# Patient Record
Sex: Male | Born: 1935 | Race: White | Hispanic: No | Marital: Married | Smoking: Never smoker
Health system: Southern US, Community
[De-identification: ages and names within clinical notes are randomized; demographics above are authoritative.]

## PROBLEM LIST (undated history)

## (undated) DIAGNOSIS — N289 Disorder of kidney and ureter, unspecified: Secondary | ICD-10-CM

## (undated) DIAGNOSIS — E785 Hyperlipidemia, unspecified: Secondary | ICD-10-CM

## (undated) DIAGNOSIS — K5790 Diverticulosis of intestine, part unspecified, without perforation or abscess without bleeding: Secondary | ICD-10-CM

## (undated) DIAGNOSIS — L570 Actinic keratosis: Secondary | ICD-10-CM

## (undated) DIAGNOSIS — I739 Peripheral vascular disease, unspecified: Secondary | ICD-10-CM

## (undated) DIAGNOSIS — Z87438 Personal history of other diseases of male genital organs: Secondary | ICD-10-CM

## (undated) DIAGNOSIS — I10 Essential (primary) hypertension: Secondary | ICD-10-CM

## (undated) DIAGNOSIS — L719 Rosacea, unspecified: Secondary | ICD-10-CM

## (undated) HISTORY — DX: Personal history of other diseases of male genital organs: Z87.438

## (undated) HISTORY — DX: Hyperlipidemia, unspecified: E78.5

## (undated) HISTORY — DX: Essential (primary) hypertension: I10

## (undated) HISTORY — DX: Diverticulosis of intestine, part unspecified, without perforation or abscess without bleeding: K57.90

## (undated) HISTORY — DX: Actinic keratosis: L57.0

## (undated) HISTORY — DX: Peripheral vascular disease, unspecified: I73.9

## (undated) HISTORY — DX: Disorder of kidney and ureter, unspecified: N28.9

## (undated) HISTORY — DX: Rosacea, unspecified: L71.9

---

## 1996-07-20 HISTORY — PX: HIP ARTHROSCOPY: SUR88

## 1998-01-03 ENCOUNTER — Encounter: Admission: RE | Admit: 1998-01-03 | Discharge: 1998-04-03 | Payer: Self-pay | Admitting: Anesthesiology

## 1998-07-20 HISTORY — PX: OTHER SURGICAL HISTORY: SHX169

## 1998-12-24 ENCOUNTER — Encounter: Payer: Self-pay | Admitting: Specialist

## 1998-12-24 ENCOUNTER — Ambulatory Visit (HOSPITAL_COMMUNITY): Admission: RE | Admit: 1998-12-24 | Discharge: 1998-12-24 | Payer: Self-pay | Admitting: Specialist

## 1999-01-27 ENCOUNTER — Encounter: Payer: Self-pay | Admitting: Specialist

## 1999-02-04 ENCOUNTER — Inpatient Hospital Stay (HOSPITAL_COMMUNITY): Admission: RE | Admit: 1999-02-04 | Discharge: 1999-02-08 | Payer: Self-pay | Admitting: Specialist

## 1999-02-04 ENCOUNTER — Encounter: Payer: Self-pay | Admitting: Specialist

## 2000-07-10 ENCOUNTER — Emergency Department (HOSPITAL_COMMUNITY): Admission: EM | Admit: 2000-07-10 | Discharge: 2000-07-10 | Payer: Self-pay | Admitting: Emergency Medicine

## 2000-07-10 ENCOUNTER — Encounter: Payer: Self-pay | Admitting: Emergency Medicine

## 2003-07-03 ENCOUNTER — Ambulatory Visit (HOSPITAL_COMMUNITY): Admission: RE | Admit: 2003-07-03 | Discharge: 2003-07-03 | Payer: Self-pay | Admitting: Gastroenterology

## 2003-07-03 ENCOUNTER — Encounter (INDEPENDENT_AMBULATORY_CARE_PROVIDER_SITE_OTHER): Payer: Self-pay | Admitting: *Deleted

## 2004-06-17 ENCOUNTER — Encounter: Admission: RE | Admit: 2004-06-17 | Discharge: 2004-06-17 | Payer: Self-pay | Admitting: Specialist

## 2004-06-30 ENCOUNTER — Encounter: Admission: RE | Admit: 2004-06-30 | Discharge: 2004-06-30 | Payer: Self-pay | Admitting: Specialist

## 2004-07-15 ENCOUNTER — Encounter: Admission: RE | Admit: 2004-07-15 | Discharge: 2004-07-15 | Payer: Self-pay | Admitting: Specialist

## 2004-07-28 ENCOUNTER — Encounter: Admission: RE | Admit: 2004-07-28 | Discharge: 2004-07-28 | Payer: Self-pay | Admitting: Specialist

## 2005-08-26 ENCOUNTER — Encounter: Admission: RE | Admit: 2005-08-26 | Discharge: 2005-08-26 | Payer: Self-pay | Admitting: Specialist

## 2005-11-20 ENCOUNTER — Encounter: Admission: RE | Admit: 2005-11-20 | Discharge: 2005-11-20 | Payer: Self-pay | Admitting: Orthopaedic Surgery

## 2005-12-04 ENCOUNTER — Encounter: Admission: RE | Admit: 2005-12-04 | Discharge: 2005-12-04 | Payer: Self-pay | Admitting: Orthopaedic Surgery

## 2005-12-21 ENCOUNTER — Ambulatory Visit: Payer: Self-pay | Admitting: Family Medicine

## 2005-12-21 ENCOUNTER — Ambulatory Visit: Payer: Self-pay | Admitting: Cardiology

## 2005-12-22 ENCOUNTER — Ambulatory Visit: Payer: Self-pay | Admitting: Family Medicine

## 2006-01-08 ENCOUNTER — Encounter: Admission: RE | Admit: 2006-01-08 | Discharge: 2006-01-08 | Payer: Self-pay | Admitting: Orthopaedic Surgery

## 2006-02-08 ENCOUNTER — Encounter: Admission: RE | Admit: 2006-02-08 | Discharge: 2006-02-08 | Payer: Self-pay | Admitting: Nephrology

## 2006-03-15 ENCOUNTER — Encounter: Admission: RE | Admit: 2006-03-15 | Discharge: 2006-03-15 | Payer: Self-pay | Admitting: Nephrology

## 2006-05-05 ENCOUNTER — Ambulatory Visit: Payer: Self-pay | Admitting: Family Medicine

## 2006-06-29 ENCOUNTER — Encounter: Admission: RE | Admit: 2006-06-29 | Discharge: 2006-06-29 | Payer: Self-pay | Admitting: Urology

## 2006-07-06 ENCOUNTER — Ambulatory Visit: Payer: Self-pay | Admitting: Family Medicine

## 2006-09-20 ENCOUNTER — Ambulatory Visit: Payer: Self-pay | Admitting: Family Medicine

## 2006-10-22 ENCOUNTER — Encounter
Admission: RE | Admit: 2006-10-22 | Discharge: 2006-10-22 | Payer: Self-pay | Admitting: Physical Medicine and Rehabilitation

## 2007-01-04 ENCOUNTER — Ambulatory Visit: Payer: Self-pay | Admitting: Family Medicine

## 2007-01-28 ENCOUNTER — Encounter: Admission: RE | Admit: 2007-01-28 | Discharge: 2007-01-28 | Payer: Self-pay | Admitting: Nephrology

## 2007-02-17 ENCOUNTER — Emergency Department (HOSPITAL_COMMUNITY): Admission: EM | Admit: 2007-02-17 | Discharge: 2007-02-17 | Payer: Self-pay | Admitting: Emergency Medicine

## 2007-04-25 ENCOUNTER — Ambulatory Visit: Payer: Self-pay | Admitting: Family Medicine

## 2007-06-27 ENCOUNTER — Ambulatory Visit: Payer: Self-pay | Admitting: Family Medicine

## 2007-07-21 HISTORY — PX: COLONOSCOPY: SHX174

## 2007-09-13 ENCOUNTER — Ambulatory Visit: Payer: Self-pay | Admitting: Family Medicine

## 2007-10-10 HISTORY — PX: DOPPLER ECHOCARDIOGRAPHY: SHX263

## 2007-10-10 HISTORY — PX: NM MYOCAR PERF WALL MOTION: HXRAD629

## 2007-12-15 IMAGING — CT CT L SPINE W/ CM
3 of 8 series · 13 of 33 positions shown, 16 images · IV contrast (omnipaque)
Comparison: none

CLINICAL DATA: Back pain with bilateral hip and leg pain worse on the right than the left.   The pain is particularly severe in the right thigh.
 LUMBAR MYELOGRAM:
TECHNIQUE: A lumbar puncture was performed from a right sided approach at the L1-2 interlaminar space using a 22 gauge spinal needle.  15 cc of Omnipaque 180 were instilled.  
 Comparison is made to the CT of 06/17/04 which showed fusion at the L5-S1 level with anterolisthesis of L4 on L5.
TECHNIQUE: Multidetector CT imaging of the lumbar spine was performed after intrathecal injection of contrast.  Multiplanar CT image reconstructions were also generated.

[Series 4: recon 3: l-spine helical · axial · 0.27mm/px · z∈[-81,+75]mm · 5 of 167 slices shown, 7 images]
[im 21/167  soft-tissue]
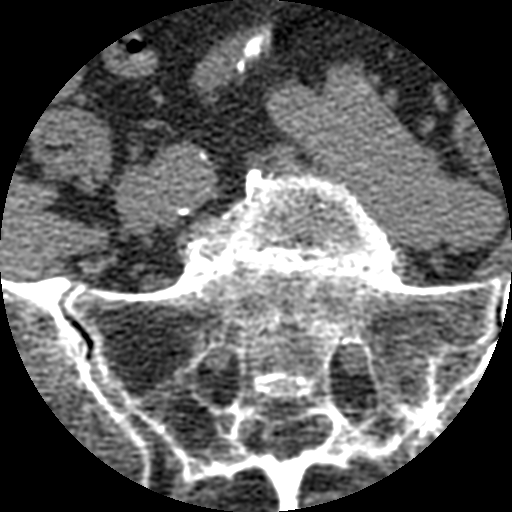
[im 21/167  bone]
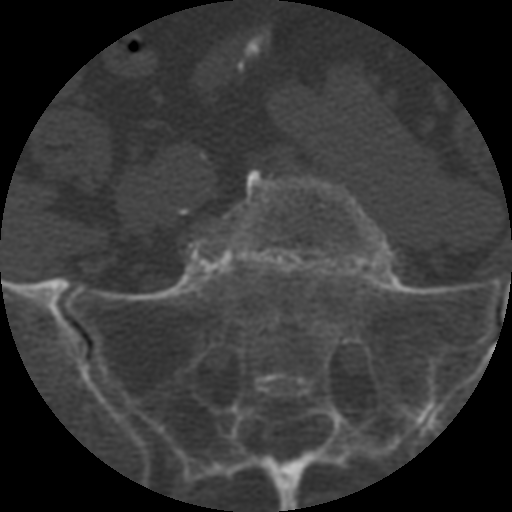
[im 63/167  bone]
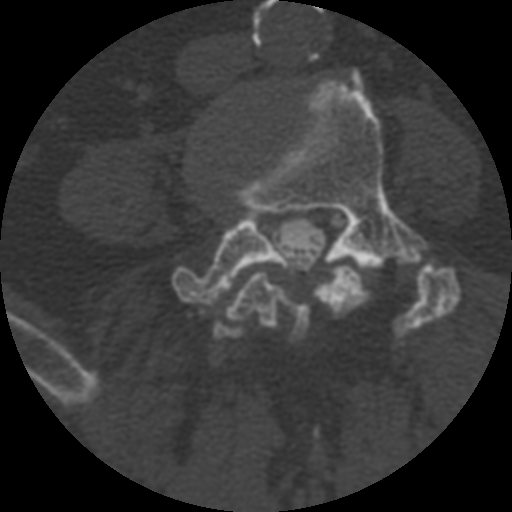
[im 84/167  bone]
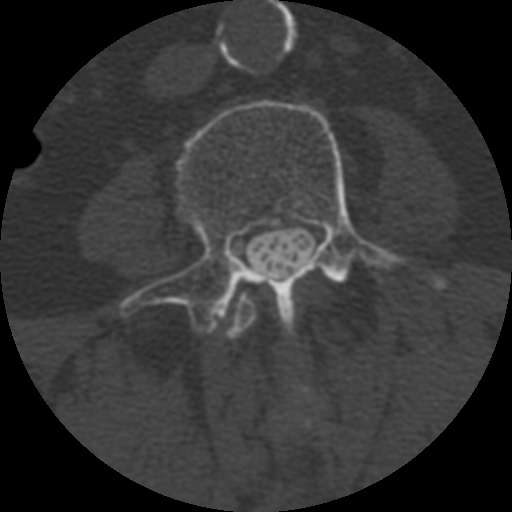
[im 104/167  bone]
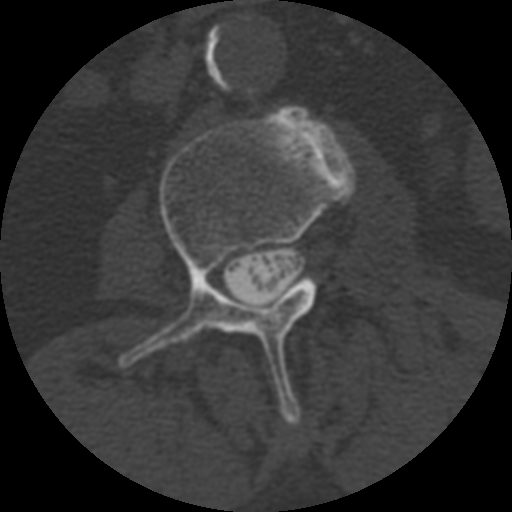
[im 146/167  soft-tissue]
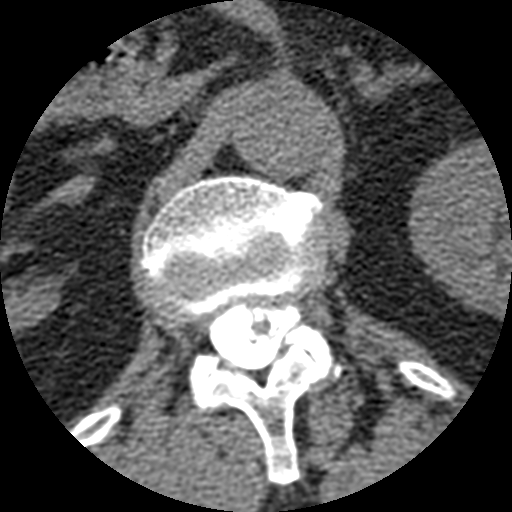
[im 146/167  bone]
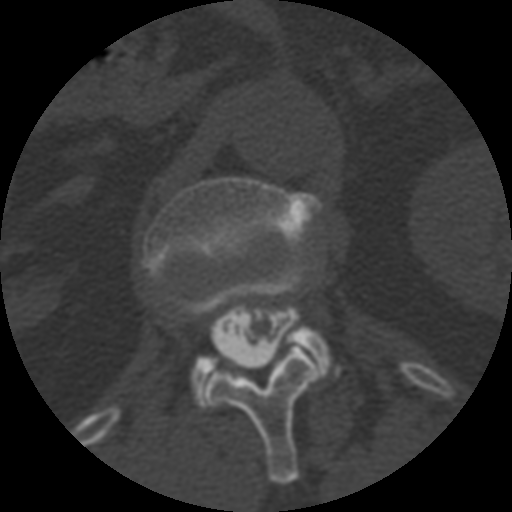

[Series 400: reformatted · sagittal · 0.42mm/px · 3 of 48 slices shown (1 of 2)]
[im 10/48  bone]
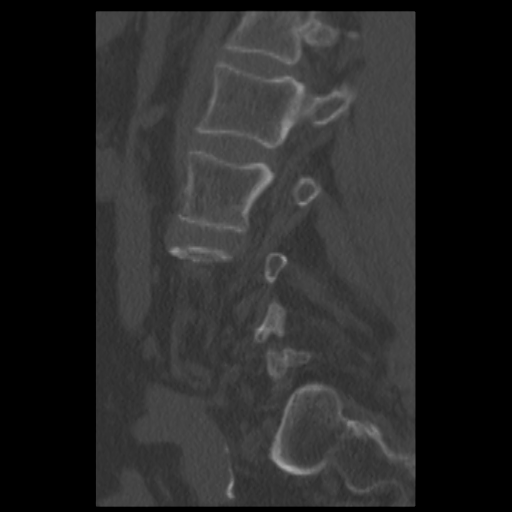
[im 19/48  bone]
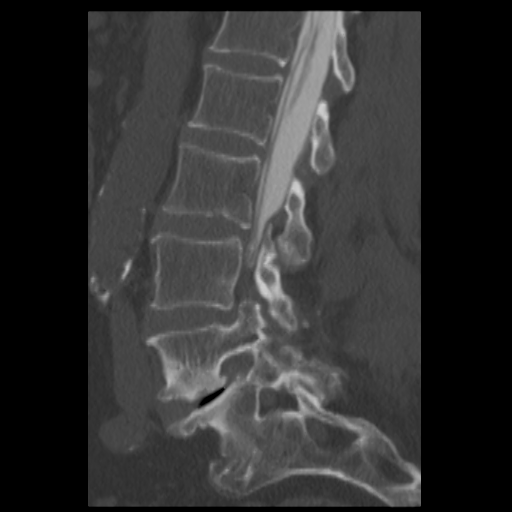
[im 29/48  bone]
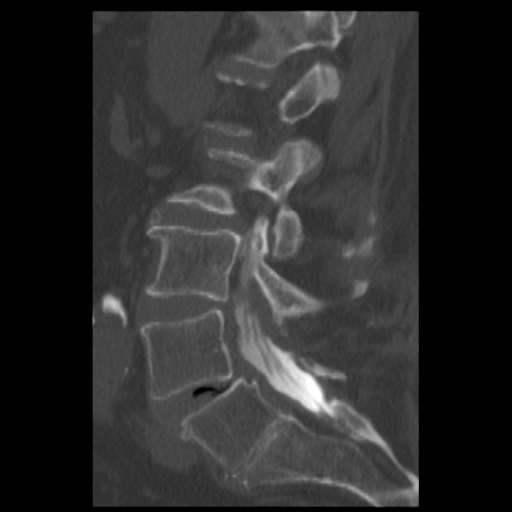

[Series 401: reformatted · coronal · 0.42mm/px · 5 of 40 slices shown, 6 images (2 of 2)]
[im 14/40  bone]
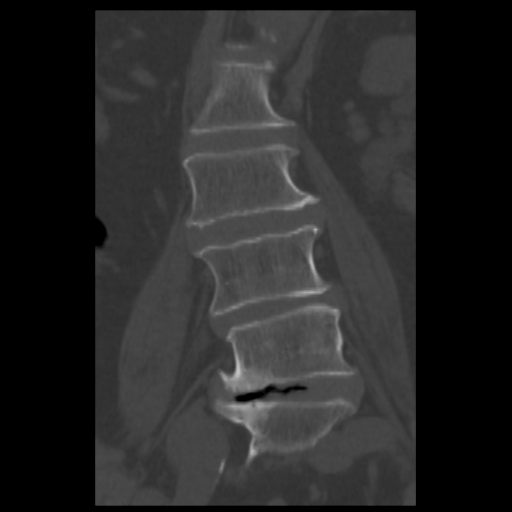
[im 17/40  bone]
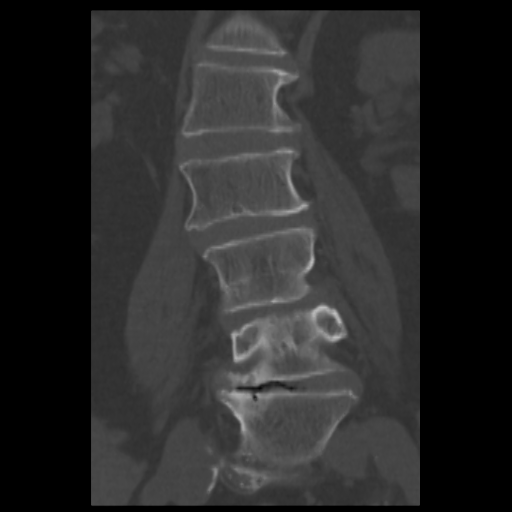
[im 20/40  soft-tissue]
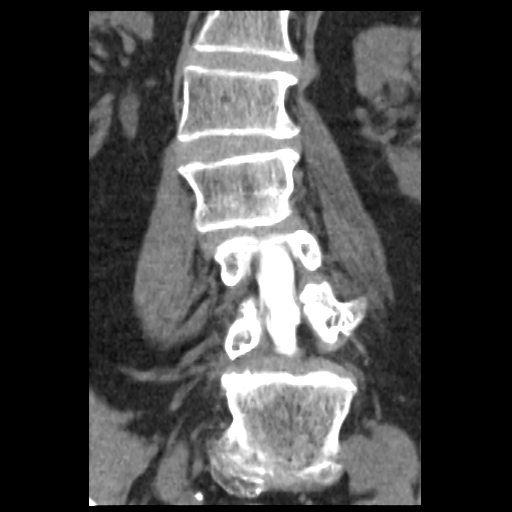
[im 20/40  bone]
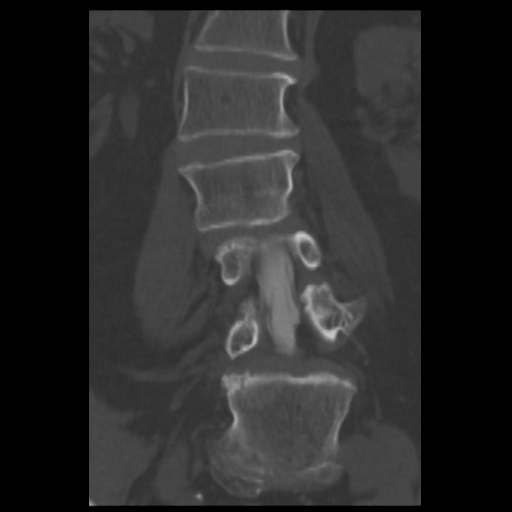
[im 23/40  bone]
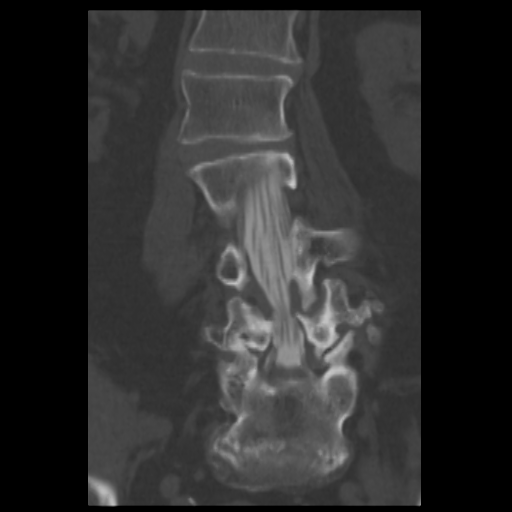
[im 27/40  bone]
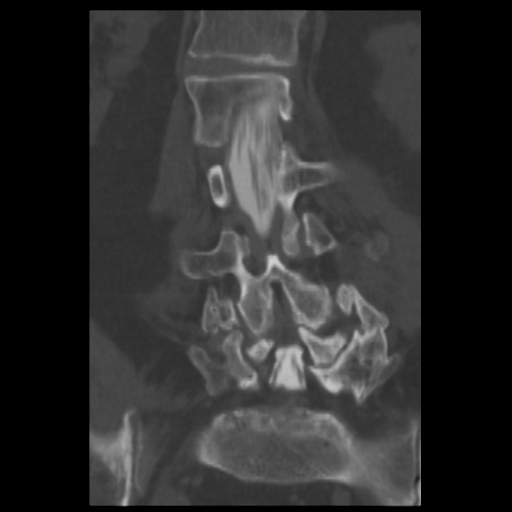

[13 of 33 positions shown; findings below may reference images not displayed]

FINDINGS: Standing lateral flexion and extension views and right to left bending views were performed.  There is scoliosis to the spine convex to the right with the apex at L1.  In the neutral position, there is 9 mm of anterolisthesis of L4 on L5.  With flexion, this may increase a mm.  With extension, I do think it changes.  
 There is a small anterior extradural defect at T12-L1 without evidence of neural compression.  No anterior extradural defect at L1-2 or L2-3. 
 At L3-4, there is small anterior extradural defect.  There is narrowing of the lateral recesses bilaterally.  With standing, this appears slightly exaggerated, particularly in the right lateral recess. 
 At L4-5, the central canal is well-decompressed, but there is narrowing of the lateral recesses, more on the right than on the left.  This does not change grossly with standing.  
 At L5-S1, no stenosis or neural compression is seen.
IMPRESSION: 1.  Small anterior extradural defect at T12-L1 without neural compression.
 2.  Degenerative disk disease at L3-4 with narrowing of the lateral recesses.  This appears slightly more pronounced on the standing films, more on the right than the left. 
 3.  Anterolisthesis at L4-5 of 9 mm.   This may increase a mm with flexion.  The patient has been decompressed posteriorly.  There is narrowing of the lateral recesses of a mild to moderate degree. 
 4.    Prior fusion at L5-S1 with apparently sufficient patency of the canal without evidence of S1 nerve root compression.  See results of CT scan below.
 POST MYELOGRAM CT SCAN OF THE LUMBAR SPINE:
FINDINGS: T12-L1:  There is a shallow broad-based disk herniation that indents the thecal sac minimally, but does not appear to result in neural compression.
 L1-2:  Minimal disk bulge.  No stenosis.
 L2-3:  Mild disk bulge.  Mild facet and ligamentous hypertrophy.  Mild narrowing of the lateral recesses without neural compression.  The foramina are widely patent. 
 L3-4:    The disk bulges mildly in a circumferential fashion. The facets and ligaments are hypertrophic.  There is mild narrowing of the lateral recesses.  Either L4 nerve root could be affected.  There is mild narrowing of the neural foramina without distinct compression of the exiting L3 nerve roots. 
 L4-5:  The patient has had previous posterior decompression.  There is anterolisthesis of L4 on L5 of 6 to 7 mm.  The disk is degenerated with vacuum phenomenon. There is circumferential protrusion of disk material eccentrically more pronounced towards the right.  There is foraminal extension on the right that seems likely to compress the right L4 nerve root.  The left L4 nerve root appears to exit without compression.  There is marked facet arthropathy bilaterally.  
 L5-S1:  There is fusion at this level.  There is not any significant disk material.  The canal and foramina appear widely patent.
IMPRESSION: 1.  At L4-5, the patient has had previous posterior decompression with sufficient decompression of the central canal.  However, there is 7 mm of anterolisthesis and there is a broad-based disk herniation eccentrically more prominent towards the right with foraminal extension on the right that seems certain to compress the right L4 nerve root.  In addition, the lateral recesses are narrowed and it is conceivable that either L5 nerve root could be irritated though they do not appear to be distinctly compressed.  Pronounced facet arthropathy bilaterally.
 2.  At L3-4, disk degeneration with shallow circumferential protrusion.   Facet and ligamentous hypertrophy.  Mild narrowing of the lateral recesses and neural foramina without distinct compression.  Note that this condition may be worsened with standing as was demonstrated at myelography.

## 2007-12-15 IMAGING — RF DG MYELOGRAM LUMBAR
12 of 15 series · 12 of 15 positions shown · IV contrast (omnipaque)
Comparison: none

CLINICAL DATA: Back pain with bilateral hip and leg pain worse on the right than the left.   The pain is particularly severe in the right thigh.
 LUMBAR MYELOGRAM:
TECHNIQUE: A lumbar puncture was performed from a right sided approach at the L1-2 interlaminar space using a 22 gauge spinal needle.  15 cc of Omnipaque 180 were instilled.  
 Comparison is made to the CT of 06/17/04 which showed fusion at the L5-S1 level with anterolisthesis of L4 on L5.
TECHNIQUE: Multidetector CT imaging of the lumbar spine was performed after intrathecal injection of contrast.  Multiplanar CT image reconstructions were also generated.

[Series 2: (hospital) · 1 of 1 slices shown]
[im 1/1]
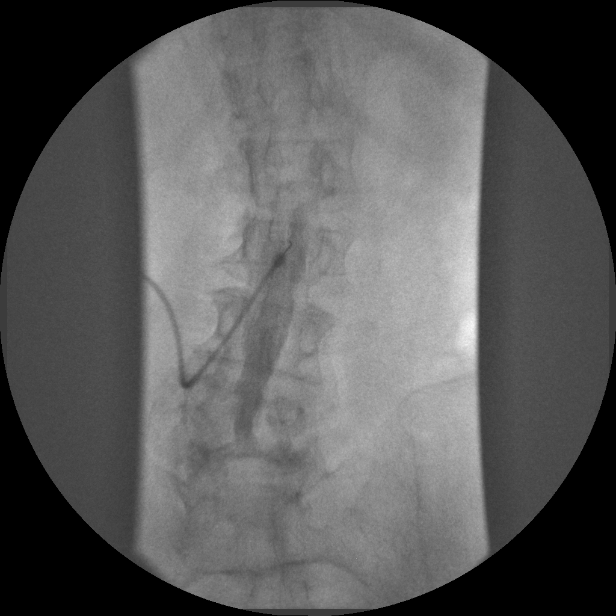

[Series 3: myelogram  white · 1 of 1 slices shown (1 of 11)]
[im 1/1]
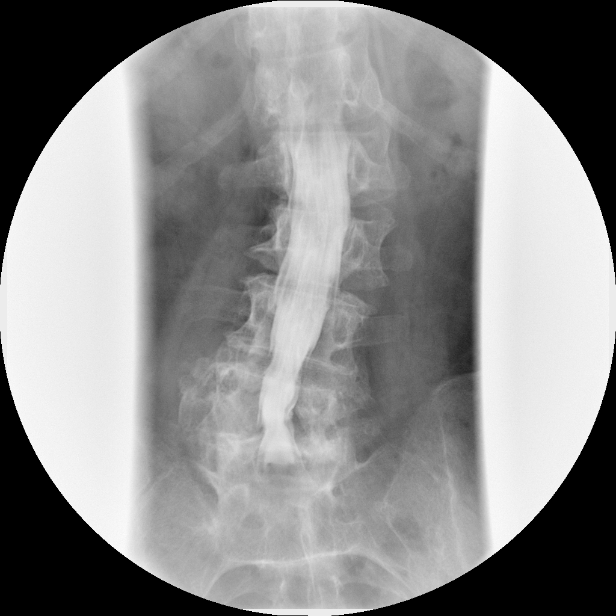

[Series 5: myelogram  white · 1 of 1 slices shown (2 of 11)]
[im 1/1]
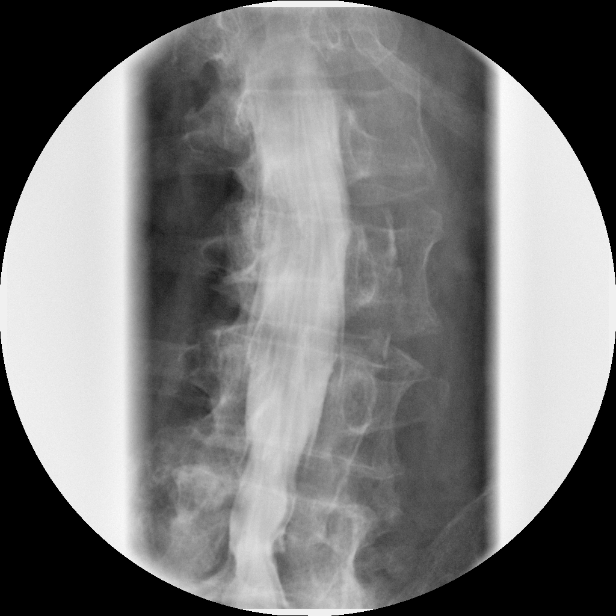

[Series 6: myelogram  white · 1 of 1 slices shown (3 of 11)]
[im 1/1]
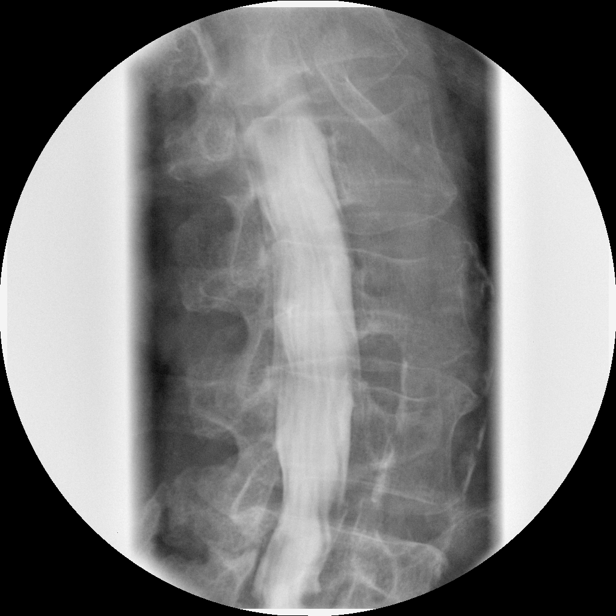

[Series 7: myelogram  white · 1 of 1 slices shown (4 of 11)]
[im 1/1]
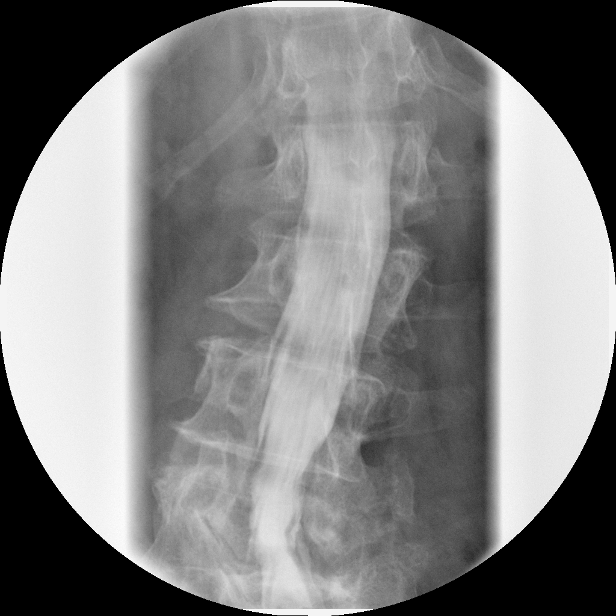

[Series 8: myelogram  white · 1 of 1 slices shown (5 of 11)]
[im 1/1]
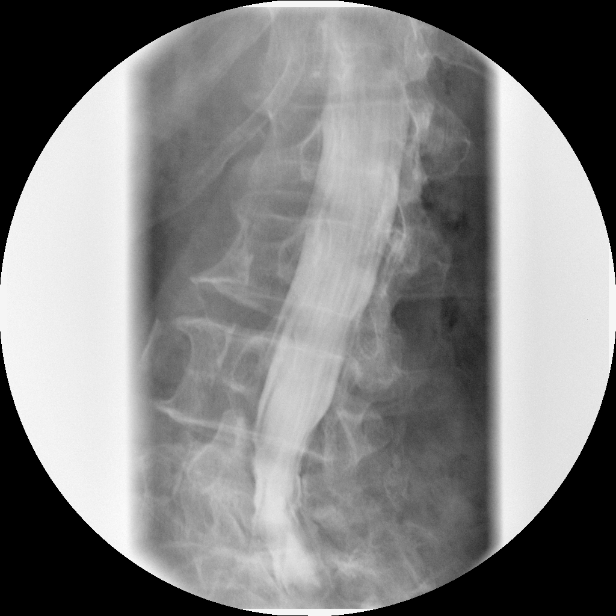

[Series 10: myelogram  white · 1 of 1 slices shown (6 of 11)]
[im 1/1]
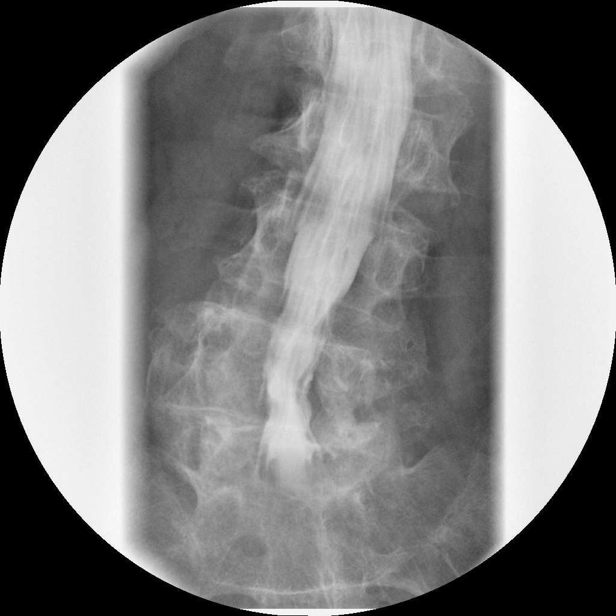

[Series 11: myelogram  white · 1 of 1 slices shown (7 of 11)]
[im 1/1]
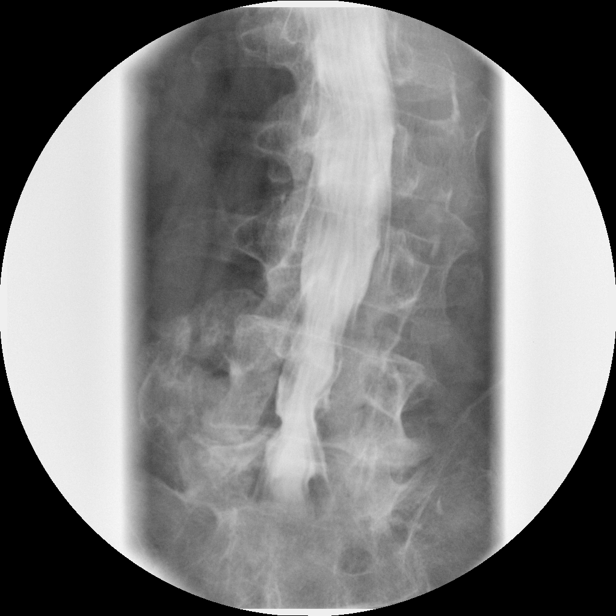

[Series 12: myelogram  white · 1 of 1 slices shown (8 of 11)]
[im 1/1]
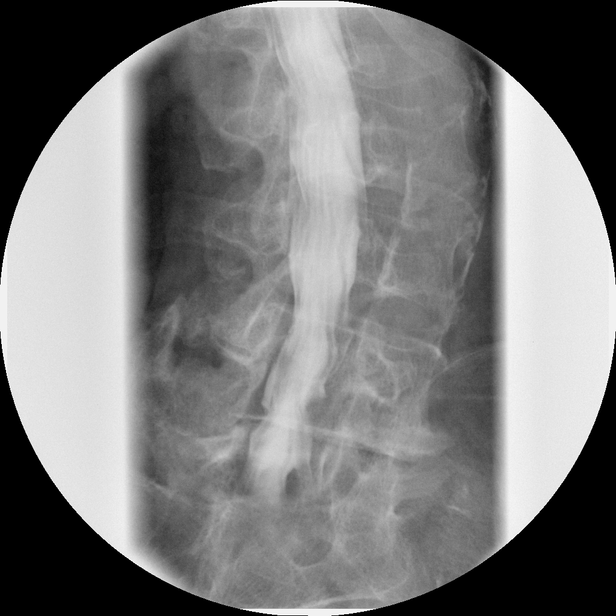

[Series 13: myelogram  white · 1 of 1 slices shown (9 of 11)]
[im 1/1]
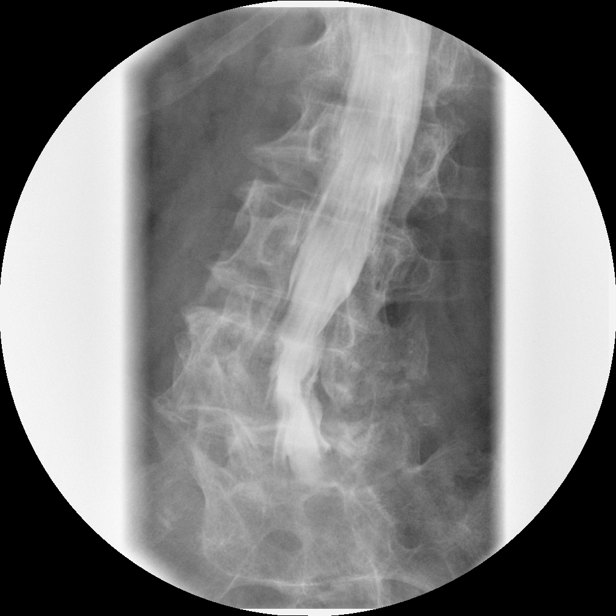

[Series 15: myelogram  white · 1 of 1 slices shown (10 of 11)]
[im 1/1]
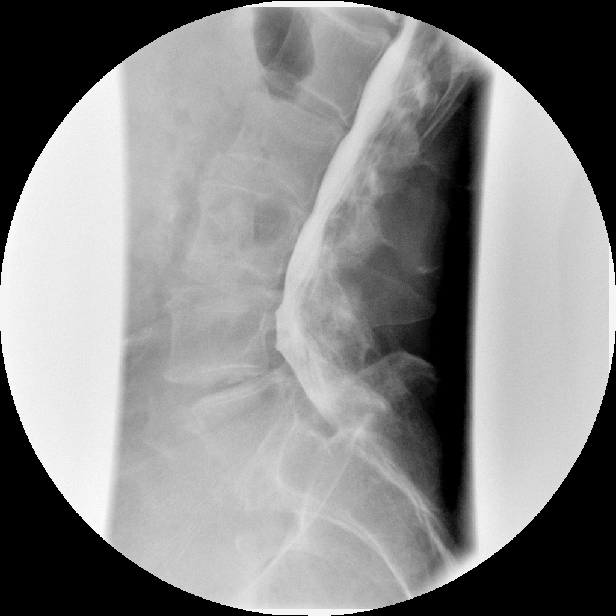

[Series 16: myelogram  white · 1 of 1 slices shown (11 of 11)]
[im 1/1]
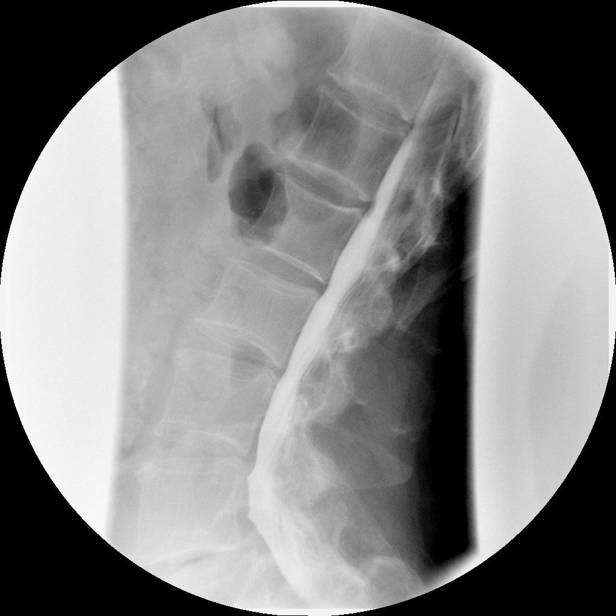

[12 of 15 positions shown; findings below may reference images not displayed]

FINDINGS: Standing lateral flexion and extension views and right to left bending views were performed.  There is scoliosis to the spine convex to the right with the apex at L1.  In the neutral position, there is 9 mm of anterolisthesis of L4 on L5.  With flexion, this may increase a mm.  With extension, I do think it changes.  
 There is a small anterior extradural defect at T12-L1 without evidence of neural compression.  No anterior extradural defect at L1-2 or L2-3. 
 At L3-4, there is small anterior extradural defect.  There is narrowing of the lateral recesses bilaterally.  With standing, this appears slightly exaggerated, particularly in the right lateral recess. 
 At L4-5, the central canal is well-decompressed, but there is narrowing of the lateral recesses, more on the right than on the left.  This does not change grossly with standing.  
 At L5-S1, no stenosis or neural compression is seen.
IMPRESSION: 1.  Small anterior extradural defect at T12-L1 without neural compression.
 2.  Degenerative disk disease at L3-4 with narrowing of the lateral recesses.  This appears slightly more pronounced on the standing films, more on the right than the left. 
 3.  Anterolisthesis at L4-5 of 9 mm.   This may increase a mm with flexion.  The patient has been decompressed posteriorly.  There is narrowing of the lateral recesses of a mild to moderate degree. 
 4.    Prior fusion at L5-S1 with apparently sufficient patency of the canal without evidence of S1 nerve root compression.  See results of CT scan below.
 POST MYELOGRAM CT SCAN OF THE LUMBAR SPINE:
FINDINGS: T12-L1:  There is a shallow broad-based disk herniation that indents the thecal sac minimally, but does not appear to result in neural compression.
 L1-2:  Minimal disk bulge.  No stenosis.
 L2-3:  Mild disk bulge.  Mild facet and ligamentous hypertrophy.  Mild narrowing of the lateral recesses without neural compression.  The foramina are widely patent. 
 L3-4:    The disk bulges mildly in a circumferential fashion. The facets and ligaments are hypertrophic.  There is mild narrowing of the lateral recesses.  Either L4 nerve root could be affected.  There is mild narrowing of the neural foramina without distinct compression of the exiting L3 nerve roots. 
 L4-5:  The patient has had previous posterior decompression.  There is anterolisthesis of L4 on L5 of 6 to 7 mm.  The disk is degenerated with vacuum phenomenon. There is circumferential protrusion of disk material eccentrically more pronounced towards the right.  There is foraminal extension on the right that seems likely to compress the right L4 nerve root.  The left L4 nerve root appears to exit without compression.  There is marked facet arthropathy bilaterally.  
 L5-S1:  There is fusion at this level.  There is not any significant disk material.  The canal and foramina appear widely patent.
IMPRESSION: 1.  At L4-5, the patient has had previous posterior decompression with sufficient decompression of the central canal.  However, there is 7 mm of anterolisthesis and there is a broad-based disk herniation eccentrically more prominent towards the right with foraminal extension on the right that seems certain to compress the right L4 nerve root.  In addition, the lateral recesses are narrowed and it is conceivable that either L5 nerve root could be irritated though they do not appear to be distinctly compressed.  Pronounced facet arthropathy bilaterally.
 2.  At L3-4, disk degeneration with shallow circumferential protrusion.   Facet and ligamentous hypertrophy.  Mild narrowing of the lateral recesses and neural foramina without distinct compression.  Note that this condition may be worsened with standing as was demonstrated at myelography.

## 2007-12-29 ENCOUNTER — Ambulatory Visit: Payer: Self-pay | Admitting: Family Medicine

## 2008-01-30 ENCOUNTER — Ambulatory Visit: Payer: Self-pay | Admitting: Family Medicine

## 2008-04-04 ENCOUNTER — Ambulatory Visit: Payer: Self-pay | Admitting: Family Medicine

## 2008-08-06 ENCOUNTER — Ambulatory Visit: Payer: Self-pay | Admitting: Family Medicine

## 2008-09-21 ENCOUNTER — Ambulatory Visit: Payer: Self-pay | Admitting: Family Medicine

## 2008-09-24 ENCOUNTER — Ambulatory Visit: Payer: Self-pay | Admitting: Family Medicine

## 2008-12-11 ENCOUNTER — Ambulatory Visit: Payer: Self-pay | Admitting: Family Medicine

## 2009-04-15 ENCOUNTER — Ambulatory Visit: Payer: Self-pay | Admitting: Family Medicine

## 2009-05-13 ENCOUNTER — Ambulatory Visit: Payer: Self-pay | Admitting: Family Medicine

## 2009-07-15 ENCOUNTER — Ambulatory Visit: Payer: Self-pay | Admitting: Family Medicine

## 2009-08-14 ENCOUNTER — Ambulatory Visit: Payer: Self-pay | Admitting: Family Medicine

## 2009-10-03 ENCOUNTER — Ambulatory Visit: Payer: Self-pay | Admitting: Family Medicine

## 2009-10-14 ENCOUNTER — Ambulatory Visit: Payer: Self-pay | Admitting: Family Medicine

## 2009-10-31 ENCOUNTER — Ambulatory Visit: Payer: Self-pay | Admitting: Family Medicine

## 2010-02-10 ENCOUNTER — Ambulatory Visit: Payer: Self-pay | Admitting: Family Medicine

## 2010-02-10 ENCOUNTER — Encounter: Admission: RE | Admit: 2010-02-10 | Discharge: 2010-02-10 | Payer: Self-pay | Admitting: Family Medicine

## 2010-04-24 ENCOUNTER — Ambulatory Visit: Payer: Self-pay | Admitting: Family Medicine

## 2010-06-18 ENCOUNTER — Ambulatory Visit: Payer: Self-pay | Admitting: Family Medicine

## 2010-07-15 ENCOUNTER — Ambulatory Visit
Admission: RE | Admit: 2010-07-15 | Discharge: 2010-07-15 | Payer: Self-pay | Source: Home / Self Care | Attending: Family Medicine | Admitting: Family Medicine

## 2010-08-06 ENCOUNTER — Ambulatory Visit
Admission: RE | Admit: 2010-08-06 | Discharge: 2010-08-06 | Payer: Self-pay | Source: Home / Self Care | Attending: Family Medicine | Admitting: Family Medicine

## 2010-08-10 ENCOUNTER — Encounter: Payer: Self-pay | Admitting: Orthopedic Surgery

## 2010-08-10 ENCOUNTER — Encounter: Payer: Self-pay | Admitting: Nephrology

## 2010-11-17 ENCOUNTER — Ambulatory Visit (INDEPENDENT_AMBULATORY_CARE_PROVIDER_SITE_OTHER): Payer: Medicare Other | Admitting: Family Medicine

## 2010-11-17 DIAGNOSIS — R3915 Urgency of urination: Secondary | ICD-10-CM

## 2010-11-17 DIAGNOSIS — M76899 Other specified enthesopathies of unspecified lower limb, excluding foot: Secondary | ICD-10-CM

## 2010-11-17 DIAGNOSIS — R32 Unspecified urinary incontinence: Secondary | ICD-10-CM

## 2010-11-22 ENCOUNTER — Encounter: Payer: Self-pay | Admitting: Family Medicine

## 2010-12-02 ENCOUNTER — Encounter: Payer: Self-pay | Admitting: Family Medicine

## 2010-12-02 ENCOUNTER — Other Ambulatory Visit: Payer: Self-pay | Admitting: Family Medicine

## 2010-12-02 ENCOUNTER — Ambulatory Visit (INDEPENDENT_AMBULATORY_CARE_PROVIDER_SITE_OTHER): Payer: Medicare Other | Admitting: Family Medicine

## 2010-12-02 DIAGNOSIS — I10 Essential (primary) hypertension: Secondary | ICD-10-CM | POA: Insufficient documentation

## 2010-12-02 DIAGNOSIS — E785 Hyperlipidemia, unspecified: Secondary | ICD-10-CM | POA: Insufficient documentation

## 2010-12-02 DIAGNOSIS — N400 Enlarged prostate without lower urinary tract symptoms: Secondary | ICD-10-CM

## 2010-12-02 MED ORDER — SOLIFENACIN SUCCINATE 5 MG PO TABS: 5.0000 mg | ORAL_TABLET | Freq: Every day | ORAL | Status: DC

## 2010-12-02 NOTE — Patient Instructions (Addendum)
Continue on  all your medications including Vesicare. Return here in the fall for a flu shot

## 2010-12-02 NOTE — Progress Notes (Signed)
  Subjective:    Patient ID: Marc Freeman, male    DOB: 03-Oct-1922, 77 y.o.   MRN: [id number]  HPI he is here for a recheck. He states that he is no longer having any urinary symptoms , specifically urge and incontinence. He also states that the injection he got last time is helping and he is no longer in pain.    Review of Systems Negative except as above    Objective:   Physical Exam Alert and in no distress. Hip exam shows full motion with no tenderness to palpation over the greater trochanter       Assessment & Plan:  BPH. Greater trochanteric bursitis. Urged him to continue on present medication regimen.

## 2010-12-05 NOTE — Op Note (Signed)
NAME:  Marc Freeman, Marc Freeman                         ACCOUNT NO.:  [id number]   MEDICAL RECORD NO.:  [id number]                   PATIENT TYPE:  AMB   LOCATION:  ENDO                                 FACILITY:  [facility]   PHYSICIAN:  Graylin Shiver, M.D.                DATE OF BIRTH:  1922/11/28   DATE OF PROCEDURE:  07/03/2003  DATE OF DISCHARGE:                                 OPERATIVE REPORT   PROCEDURE:  Colonoscopy.   INDICATIONS FOR PROCEDURE:  Screening. Informed consent was obtained after  explanation of the risks of bleeding, infection and perforation.   PREMEDICATIONS:  Fentanyl 40 micrograms IV, Versed 4 mg IV.   DESCRIPTION OF PROCEDURE:  With the patient in the left lateral decubitus  position a rectal examination  was performed and no masses were felt. The  Olympus colonoscope was inserted into the rectum and advanced  around the  colon to the cecum. The cecal landmarks were identified. In the cecum there  was a 4 to 5-mm sessile polyp which was removed by snare cautery technique.  The polyp  was retrieved. The cautery site looked good. The ascending colon  looked normal, the transverse colon normal, the descending colon normal. The  sigmoid showed a few scattered diverticula. The rectum was normal. The  patient tolerated the procedure well without complications.   IMPRESSION:  1. Small cecal polyp.  2. Diverticulosis of the sigmoid.   PLAN:  The pathology will be checked.                                               Graylin Shiver, M.D.    SFG/MEDQ  D:  07/03/2003  T:  07/03/2003  Job:  [id number]   cc:   Sharlot Gowda, M.D.  [address] [place], [place] [address]  Fax: [phone]

## 2011-01-11 ENCOUNTER — Other Ambulatory Visit: Payer: Self-pay | Admitting: Family Medicine

## 2011-01-11 DIAGNOSIS — R3915 Urgency of urination: Secondary | ICD-10-CM

## 2011-01-11 DIAGNOSIS — N400 Enlarged prostate without lower urinary tract symptoms: Secondary | ICD-10-CM

## 2011-01-12 ENCOUNTER — Other Ambulatory Visit (INDEPENDENT_AMBULATORY_CARE_PROVIDER_SITE_OTHER): Payer: Medicare Other | Admitting: Medical

## 2011-01-12 DIAGNOSIS — R3915 Urgency of urination: Secondary | ICD-10-CM

## 2011-01-12 DIAGNOSIS — N400 Enlarged prostate without lower urinary tract symptoms: Secondary | ICD-10-CM

## 2011-01-12 MED ORDER — SOLIFENACIN SUCCINATE 5 MG PO TABS: 5.0000 mg | ORAL_TABLET | Freq: Every day | ORAL | Status: DC

## 2011-02-13 ENCOUNTER — Telehealth: Payer: Self-pay | Admitting: Family Medicine

## 2011-02-13 MED ORDER — METOPROLOL SUCCINATE ER 50 MG PO TB24: 50.0000 mg | ORAL_TABLET | Freq: Every day | ORAL | Status: DC

## 2011-02-13 NOTE — Telephone Encounter (Signed)
Toprol renewed 

## 2011-02-18 ENCOUNTER — Telehealth: Payer: Self-pay | Admitting: Family Medicine

## 2011-02-18 DIAGNOSIS — R3915 Urgency of urination: Secondary | ICD-10-CM

## 2011-02-18 DIAGNOSIS — N400 Enlarged prostate without lower urinary tract symptoms: Secondary | ICD-10-CM

## 2011-02-18 MED ORDER — TAMSULOSIN HCL 0.4 MG PO CAPS: 0.4000 mg | ORAL_CAPSULE | Freq: Every day | ORAL | Status: DC

## 2011-02-18 NOTE — Telephone Encounter (Signed)
FILLED BY [employer]

## 2011-02-18 NOTE — Telephone Encounter (Signed)
New dosing called in

## 2011-02-18 NOTE — Telephone Encounter (Signed)
90 day supply was called in.

## 2011-04-23 ENCOUNTER — Other Ambulatory Visit (INDEPENDENT_AMBULATORY_CARE_PROVIDER_SITE_OTHER): Payer: Medicare Other

## 2011-04-23 DIAGNOSIS — Z23 Encounter for immunization: Secondary | ICD-10-CM

## 2011-05-07 ENCOUNTER — Other Ambulatory Visit: Payer: Self-pay | Admitting: Family Medicine

## 2011-05-07 NOTE — Telephone Encounter (Signed)
Is this okay to refill? 

## 2011-09-01 ENCOUNTER — Other Ambulatory Visit: Payer: Self-pay | Admitting: Family Medicine

## 2011-09-15 ENCOUNTER — Encounter: Payer: Self-pay | Admitting: Family Medicine

## 2011-09-15 ENCOUNTER — Ambulatory Visit (INDEPENDENT_AMBULATORY_CARE_PROVIDER_SITE_OTHER): Payer: Medicare Other | Admitting: Family Medicine

## 2011-09-15 DIAGNOSIS — E785 Hyperlipidemia, unspecified: Secondary | ICD-10-CM

## 2011-09-15 DIAGNOSIS — N289 Disorder of kidney and ureter, unspecified: Secondary | ICD-10-CM

## 2011-09-15 DIAGNOSIS — I10 Essential (primary) hypertension: Secondary | ICD-10-CM

## 2011-09-15 DIAGNOSIS — Z79899 Other long term (current) drug therapy: Secondary | ICD-10-CM

## 2011-09-15 DIAGNOSIS — M7061 Trochanteric bursitis, right hip: Secondary | ICD-10-CM

## 2011-09-15 DIAGNOSIS — M76899 Other specified enthesopathies of unspecified lower limb, excluding foot: Secondary | ICD-10-CM

## 2011-09-15 LAB — CBC WITH DIFFERENTIAL/PLATELET
Basophils Absolute: 0 10*3/uL (ref 0.0–0.1)
Eosinophils Relative: 2 % (ref 0–5)
HCT: 40.5 % (ref 39.0–52.0)
Lymphocytes Relative: 19 % (ref 12–46)
Lymphs Abs: 2.2 10*3/uL (ref 0.7–4.0)
MCV: 91 fL (ref 78.0–100.0)
Monocytes Absolute: 0.7 10*3/uL (ref 0.1–1.0)
Neutro Abs: 7.9 10*3/uL — ABNORMAL HIGH (ref 1.7–7.7)
RBC: 4.45 MIL/uL (ref 4.22–5.81)
RDW: 13 % (ref 11.5–15.5)
WBC: 11.1 10*3/uL — ABNORMAL HIGH (ref 4.0–10.5)

## 2011-09-15 LAB — LIPID PANEL
HDL: 48 mg/dL (ref >39)
LDL Cholesterol: 90 mg/dL (ref 0–99)
Total CHOL/HDL Ratio: 3.8 Ratio
Triglycerides: 212 mg/dL — ABNORMAL HIGH (ref <150)

## 2011-09-15 LAB — COMPREHENSIVE METABOLIC PANEL
Alkaline Phosphatase: 86 U/L (ref 39–117)
CO2: 24 mEq/L (ref 19–32)
Creat: 1.58 mg/dL — ABNORMAL HIGH (ref 0.50–1.35)
Glucose, Bld: 95 mg/dL (ref 70–99)
Total Bilirubin: 0.5 mg/dL (ref 0.3–1.2)

## 2011-09-15 NOTE — Progress Notes (Signed)
  Subjective:    Patient ID: Marc Freeman, male    DOB: April 18, 1923, 78 y.o.   MRN: [id number]  HPI He is here for a general checkup. He continues on medications listed in the chart. He has no concerns or complaints about them. He has had difficulty with right hip pain. He has a previous history of injection which was approximately a year and a half ago. It worked quite well to relieve the pain. He would like another injection. He has been retired for several decades. He and his wife enjoy a very active life and travels several times to [facility].   Review of Systems Negative except as above    Objective:   Physical Exam BP 112/70  Pulse 61  Ht 5' 6.5" (1.689 m)  Wt 174 lb (78.926 kg)  BMI 27.66 kg/m2  General Appearance:    Alert, cooperative, no distress, appears stated age  Head:    Normocephalic, without obvious abnormality, atraumatic  Eyes:    PERRL, conjunctiva/corneas clear, EOM's intact, fundi    benign  Ears:    Normal TM's and external ear canals  Nose:   Nares normal, mucosa normal, no drainage or sinus   tenderness  Throat:   Lips, mucosa, and tongue normal; teeth and gums normal  Neck:   Supple, no lymphadenopathy;  thyroid:  no   enlargement/tenderness/nodules; no carotid   bruit or JVD  Back:    Spine nontender, no curvature, ROM normal, no CVA     tenderness  Lungs:     Clear to auscultation bilaterally without wheezes, rales or     ronchi; respirations unlabored  Chest Wall:    No tenderness or deformity   Heart:    Regular rate and rhythm, S1 and S2 normal, no murmur, rub   or gallop  Breast Exam:    No chest wall tenderness, masses or gynecomastia  Abdomen:     Soft, non-tender, nondistended, normoactive bowel sounds,    no masses, no hepatosplenomegaly  Genitalia:   deferred   Rectal:   deferred   Extremities:   No clubbing, cyanosis or edema.tender to palpation over the right greater trochanter. Motion of the hip.   Pulses:   2+ and symmetric all  extremities  Skin:   Skin color, texture, turgor normal, no rashes or lesions  Lymph nodes:   Cervical, supraclavicular, and axillary nodes normal  Neurologic:   CNII-XII intact, normal strength, sensation and gait; reflexes 2+ and symmetric throughout          Psych:   Normal mood, affect, hygiene and grooming.           Assessment & Plan:   1. Encounter for long-term (current) use of other medications  CBC with Differential, Lipid panel, Comp Met (CMET)  2. Hypertension  CBC with Differential, Comp Met (CMET)  3. Hyperlipidemia  Lipid panel  4. Renal insufficiency  CBC with Differential, Comp Met (CMET)  5. Trochanteric bursitis of right hip     the point of maximum pain was identified over the right greater trochanter. 40 mg of Kenalog and 2 cc of Xylocaine was injected with relief of his symptoms.

## 2011-09-16 ENCOUNTER — Encounter: Payer: Self-pay | Admitting: Internal Medicine

## 2011-09-16 NOTE — Progress Notes (Signed)
Quick Note:  The blood work is normal ______ 

## 2011-09-17 ENCOUNTER — Other Ambulatory Visit: Payer: Self-pay | Admitting: Family Medicine

## 2011-10-14 ENCOUNTER — Ambulatory Visit (INDEPENDENT_AMBULATORY_CARE_PROVIDER_SITE_OTHER): Payer: Medicare Other | Admitting: Family Medicine

## 2011-10-14 ENCOUNTER — Encounter: Payer: Self-pay | Admitting: Family Medicine

## 2011-10-14 VITALS — BP 124/80 | Temp 97.5°F | Wt 169.0 lb

## 2011-10-14 DIAGNOSIS — K529 Noninfective gastroenteritis and colitis, unspecified: Secondary | ICD-10-CM

## 2011-10-14 DIAGNOSIS — I499 Cardiac arrhythmia, unspecified: Secondary | ICD-10-CM

## 2011-10-14 DIAGNOSIS — K5289 Other specified noninfective gastroenteritis and colitis: Secondary | ICD-10-CM

## 2011-10-14 NOTE — Progress Notes (Signed)
  Subjective:    Patient ID: Marc Freeman, male    DOB: 31-Mar-1923, 78 y.o.   MRN: [id number]  HPI He complains of a two-day history of difficulty with dizziness, nausea, vomiting and diarrhea. His wife had similar symptoms that started one day later.   Review of Systems     Objective:   Physical Exam alert and in no distress. Tympanic membranes and canals are normal. Throat is clear. Tonsils are normal. Neck is supple without adenopathy or thyromegaly. Cardiac exam shows an irregular sinus rhythm without murmurs or gallops. Lungs are clear to auscultation. Abdominal exam shows normal bowel sounds without masses or tenderness. EKG shows a regular sinus rhythm and reassessment after the EKG showed that he was back in regular rhythm.       Assessment & Plan:   1. Gastroenteritis, acute    2. Arrhythmia  PR ELECTROCARDIOGRAM, COMPLETE   supportive care including fluids and Imodium. He will call if further difficulty.

## 2011-10-14 NOTE — Patient Instructions (Signed)
Use Imodium for the diarrhea. Keep drinking fluids.

## 2011-10-20 ENCOUNTER — Other Ambulatory Visit: Payer: Self-pay | Admitting: Family Medicine

## 2011-10-21 ENCOUNTER — Other Ambulatory Visit: Payer: Self-pay | Admitting: Dermatology

## 2011-10-21 NOTE — Telephone Encounter (Signed)
Tramadol renewed.

## 2011-10-21 NOTE — Telephone Encounter (Signed)
Is this ok?

## 2011-11-04 ENCOUNTER — Encounter: Payer: Self-pay | Admitting: Nephrology

## 2011-11-19 ENCOUNTER — Encounter: Payer: Self-pay | Admitting: Family Medicine

## 2011-11-19 ENCOUNTER — Ambulatory Visit (INDEPENDENT_AMBULATORY_CARE_PROVIDER_SITE_OTHER): Payer: Medicare Other | Admitting: Family Medicine

## 2011-11-19 VITALS — BP 124/80 | HR 61 | Wt 171.0 lb

## 2011-11-19 DIAGNOSIS — M7061 Trochanteric bursitis, right hip: Secondary | ICD-10-CM

## 2011-11-19 DIAGNOSIS — M76899 Other specified enthesopathies of unspecified lower limb, excluding foot: Secondary | ICD-10-CM

## 2011-11-19 NOTE — Progress Notes (Signed)
  Subjective:    Patient ID: Marc Freeman, male    DOB: October 27, 1922, 78 y.o.   MRN: [id number]  HPI He is here for consult concerning a recurrence of right hip pain. He states the initial shot gave him roughly 6-8 weeks worth of benefit he is now having difficulty with this area again. He does not complaining of back pain but does complain of some slight weakness due to the pain.  Review of Systems     Objective:   Physical Exam Alert and in no distress. Full motion of the hip without pain. No tenderness over SI joints. Slight tenderness to palpation over greater trochanter.      Assessment & Plan:   1. Trochanteric bursitis of right hip    I discussed options with him concerning this. We have decided to inject it again. The hip was prepped with Betadine. Point of maximal pain was identified. 40 mg of Depo-Medrol as well as 3 cc of Xylocaine was injected with some relief of his symptoms. He explains that if he has further difficulty, further evaluation to look for other underlying causes will need to be done.

## 2011-11-19 NOTE — Patient Instructions (Signed)
Call if her symptoms reoccur for consult for further evaluation of other issues.

## 2011-11-30 ENCOUNTER — Telehealth: Payer: Self-pay | Admitting: Family Medicine

## 2011-11-30 NOTE — Telephone Encounter (Signed)
Let's set him up for LS-spine films.

## 2011-12-01 ENCOUNTER — Ambulatory Visit
Admission: RE | Admit: 2011-12-01 | Discharge: 2011-12-01 | Disposition: A | Discharge status: Home / Self Care | Payer: Medicare Other | Source: Ambulatory Visit | Attending: Family Medicine | Admitting: Family Medicine

## 2011-12-01 ENCOUNTER — Other Ambulatory Visit: Payer: Self-pay

## 2011-12-01 DIAGNOSIS — R52 Pain, unspecified: Secondary | ICD-10-CM

## 2011-12-01 NOTE — Telephone Encounter (Signed)
Order in epic and pt aware

## 2011-12-16 ENCOUNTER — Other Ambulatory Visit: Payer: Self-pay | Admitting: Family Medicine

## 2011-12-17 DIAGNOSIS — I739 Peripheral vascular disease, unspecified: Secondary | ICD-10-CM

## 2011-12-17 HISTORY — DX: Peripheral vascular disease, unspecified: I73.9

## 2012-01-13 ENCOUNTER — Telehealth: Payer: Self-pay | Admitting: Internal Medicine

## 2012-01-13 DIAGNOSIS — N400 Enlarged prostate without lower urinary tract symptoms: Secondary | ICD-10-CM

## 2012-01-13 DIAGNOSIS — R3915 Urgency of urination: Secondary | ICD-10-CM

## 2012-01-13 MED ORDER — TAMSULOSIN HCL 0.4 MG PO CAPS: 0.4000 mg | ORAL_CAPSULE | Freq: Every day | ORAL | Status: DC

## 2012-01-13 NOTE — Telephone Encounter (Signed)
flomax called in.

## 2012-03-10 ENCOUNTER — Ambulatory Visit (INDEPENDENT_AMBULATORY_CARE_PROVIDER_SITE_OTHER): Payer: Medicare Other | Admitting: Family Medicine

## 2012-03-10 ENCOUNTER — Encounter: Payer: Self-pay | Admitting: Family Medicine

## 2012-03-10 VITALS — BP 110/70 | HR 60 | Temp 98.1°F | Wt 167.0 lb

## 2012-03-10 DIAGNOSIS — J029 Acute pharyngitis, unspecified: Secondary | ICD-10-CM

## 2012-03-10 DIAGNOSIS — J310 Chronic rhinitis: Secondary | ICD-10-CM

## 2012-03-10 NOTE — Patient Instructions (Addendum)
Try Claritin to help with a runny nose and see what that'll do. Gargle with salt water for your sore throat. Take Tylenol if you need to.

## 2012-03-10 NOTE — Progress Notes (Signed)
  Subjective:    Patient ID: Marc Freeman, male    DOB: [birth year], 78 y.o.   MRN: [id number]  HPI Has a three-week history of runny nose and a two-day history of sore throat. No fever, chills, cough or congestion.   Review of Systems     Objective:   Physical Exam alert and in no distress. Tympanic membranes and canals are normal. Throat is clear. Tonsils are normal. Neck is supple without adenopathy or thyromegaly. Cardiac exam shows a regular sinus rhythm with 2/6 SEM,no gallops. Lungs are clear to auscultation.        Assessment & Plan:

## 2012-03-11 ENCOUNTER — Other Ambulatory Visit: Payer: Self-pay | Admitting: Family Medicine

## 2012-03-14 NOTE — Telephone Encounter (Signed)
Is this ok?

## 2012-05-27 ENCOUNTER — Other Ambulatory Visit: Payer: Self-pay | Admitting: Family Medicine

## 2012-08-19 ENCOUNTER — Other Ambulatory Visit: Payer: Self-pay | Admitting: Family Medicine

## 2012-08-22 NOTE — Telephone Encounter (Signed)
Is this okay?

## 2012-10-14 ENCOUNTER — Other Ambulatory Visit: Payer: Self-pay | Admitting: Family Medicine

## 2012-10-17 ENCOUNTER — Encounter: Payer: Self-pay | Admitting: Family Medicine

## 2012-10-17 ENCOUNTER — Ambulatory Visit (INDEPENDENT_AMBULATORY_CARE_PROVIDER_SITE_OTHER): Payer: Medicare Other | Admitting: Family Medicine

## 2012-10-17 VITALS — BP 116/70 | HR 68 | Wt 171.0 lb

## 2012-10-17 DIAGNOSIS — R32 Unspecified urinary incontinence: Secondary | ICD-10-CM

## 2012-10-17 DIAGNOSIS — N400 Enlarged prostate without lower urinary tract symptoms: Secondary | ICD-10-CM

## 2012-10-17 DIAGNOSIS — I10 Essential (primary) hypertension: Secondary | ICD-10-CM

## 2012-10-17 LAB — POCT URINALYSIS DIPSTICK
Bilirubin, UA: NEGATIVE
Blood, UA: NEGATIVE
Glucose, UA: NEGATIVE
Spec Grav, UA: 1.015

## 2012-10-17 NOTE — Progress Notes (Signed)
  Subjective:    Patient ID: Marc Freeman, male    DOB: 1923-06-15, 79 y.o.   MRN: [id number]  HPI He is here for consult concerning a six-month history of difficulty with urinary incontinence. He states that he has been using Vesicare intermittently for the last 2 years although I have no record of that. He has been on Flomax as well.he states that on occasion he will have a feeling of incomplete emptying and bladder fullness and then at other times had the urge and before he can do anything will have incontinence.   Review of Systems     Objective:   Physical Exam Alert and in no distress. Urine dipstick was negative.       Assessment & Plan:  BPH (benign prostatic hyperplasia) - Plan: POCT urinalysis dipstick  Hypertension - Plan: POCT urinalysis dipstick  Urinary incontinence I explained that I did not feel comfortable having him on both these medications and wanted him referred to urology to help further delineate this.

## 2012-11-15 ENCOUNTER — Encounter: Payer: Self-pay | Admitting: Family Medicine

## 2012-12-14 ENCOUNTER — Other Ambulatory Visit: Payer: Self-pay | Admitting: Family Medicine

## 2013-01-16 ENCOUNTER — Other Ambulatory Visit: Payer: Self-pay | Admitting: Family Medicine

## 2013-01-16 NOTE — Telephone Encounter (Signed)
IS THIS OK 

## 2013-02-12 ENCOUNTER — Encounter: Payer: Self-pay | Admitting: *Deleted

## 2013-02-14 ENCOUNTER — Encounter: Payer: Self-pay | Admitting: Cardiology

## 2013-02-14 ENCOUNTER — Ambulatory Visit (INDEPENDENT_AMBULATORY_CARE_PROVIDER_SITE_OTHER): Payer: Medicare Other | Admitting: Cardiology

## 2013-02-14 VITALS — BP 124/72 | HR 60 | Ht 69.0 in | Wt 172.7 lb

## 2013-02-14 DIAGNOSIS — I10 Essential (primary) hypertension: Secondary | ICD-10-CM

## 2013-02-14 DIAGNOSIS — E785 Hyperlipidemia, unspecified: Secondary | ICD-10-CM

## 2013-02-14 DIAGNOSIS — I771 Stricture of artery: Secondary | ICD-10-CM

## 2013-02-14 NOTE — Patient Instructions (Addendum)
Your physician has requested that you have a carotid duplex. This test is an ultrasound of the carotid arteries in your neck. It looks at blood flow through these arteries that supply the brain with blood. Allow one hour for this exam. There are no restrictions or special instructions.  Your physician recommends that you schedule a follow-up appointment in 12 months  Dr Lonell Grandchild

## 2013-02-15 ENCOUNTER — Other Ambulatory Visit: Payer: Self-pay | Admitting: Family Medicine

## 2013-02-15 ENCOUNTER — Encounter: Payer: Self-pay | Admitting: Cardiology

## 2013-02-21 ENCOUNTER — Encounter: Payer: Self-pay | Admitting: Cardiology

## 2013-02-21 DIAGNOSIS — I771 Stricture of artery: Secondary | ICD-10-CM | POA: Insufficient documentation

## 2013-02-21 NOTE — Assessment & Plan Note (Addendum)
Asymptomatic and stable.  At last check was on the low end of the 70-99% range.  Plan: Continue aggressive risk factor modification with lipid, blood pressure and glucose control. Recheck next year before next visit. Continue aspirin, statin, beta blocker and calcium channel blocker I educated him on the signs and symptoms of subclavian steal. He should be on the lookout for these episodes or symptoms. Such symptoms would be an indication for intervention.

## 2013-02-21 NOTE — Assessment & Plan Note (Signed)
On statin. Labs followed by primary physician.

## 2013-02-21 NOTE — Progress Notes (Signed)
Patient ID: Marc Freeman, male   DOB: Sep 01, 1922, 79 y.o.   MRN: [id number]  PCP: Carollee Herter, MD  Clinic Note: Chief Complaint  Patient presents with  . ROV 1 year    Edema ankles-right ankle is worse   HPI: Marc Freeman is a 79 y.o. male with a PMH below who presents today for what amounts to be in annual followup.  As you recall he is a history of hypertension, dyslipidemia, LVH and swells carotid/subclavian artery disease most notably on the left side he is formally followed by Dr. Chanda Busing, and [place].  I last saw him in July of last year. He was doing quite well he does have his carotid Dopplers performed to evaluate his left subclavian artery disease.  Interval History: He continues to be doing very well with no major complaints. Despite having the subclavian stenosis, he denies any symptoms suggestive of subclavian steal. No lightheadedness or dizziness or wooziness, no significant near-syncope --with or without the use of his left arm. He denies any chest pain or shortness of breath with rest or exertion. He denies any TIA or amaurosis fugax symptoms. No melena, hematochezia or hematuria. No claudication symptoms.  The remainder of cardiac review of systems is as follows: Cardiovascular ROS: negative for - chest pain, dyspnea on exertion, edema, irregular heartbeat, loss of consciousness, murmur, orthopnea, palpitations, paroxysmal nocturnal dyspnea, rapid heart rate or shortness of breath :  Past Medical History  Diagnosis Date  . Hypertension   . Actinic keratosis   . Hearing loss     SENSORI NEURAL HEARING LOSS  . History of BPH   . Dyslipidemia   . Renal insufficiency   . Diverticulosis   . Hyperlipidemia   . Subclavian artery disease 12/17/2011    bilateral systolic brachial pressures on right and left ,bilatera internal carotid mild to moderate;left sublclavian 70-995  . Rosacea     Prior Cardiac Evaluation and Past Surgical  History: Past Surgical History  Procedure Laterality Date  . Hip arthroscopy  98    LEFT HIP DR Meade Maw  . Lys decompression  2000  . Colonoscopy  2009    GANEM  . Doppler echocardiography  10/10/2007    moderate concentric LVH ,EF 55% , mild sclerrotic aortic valve with trace without regurg. and mild tricuspid regurg.  Marland Kitchen Nm myocar perf wall motion  10/10/2007    no ischemia ,no infarction with just diaphragmatic attentuation    Allergies  Allergen Reactions  . Penicillins    Current Outpatient Prescriptions  Medication Sig Dispense Refill  . aspirin 81 MG tablet Take 81 mg by mouth daily.        Marland Kitchen doxycycline (MONODOX) 100 MG capsule Take 100 mg by mouth daily.      . finasteride (PROSCAR) 5 MG tablet Take 5 mg by mouth daily.      . Ibuprofen (ADVIL PO) Take 1 capsule by mouth as needed (for pain).      . metoprolol (TOPROL-XL) 50 MG 24 hr tablet Take 1 tablet (50 mg total) by mouth daily.  30 tablet  11  . Multiple Vitamins-Minerals (MULTIVITAMIN WITH MINERALS) tablet Take 1 tablet by mouth daily.        . simvastatin (ZOCOR) 20 MG tablet TAKE 1 TABLET BY MOUTH ONCE A DAY  90 tablet  PRN  . Tamsulosin HCl (FLOMAX) 0.4 MG CAPS TAKE ONE CAPSULE BY MOUTH EVERY DAY  30 capsule  9  . traMADol (ULTRAM)  50 MG tablet TAKE 1 TABLET TWICE A DAY AS NEEDED FOR PAIN  30 tablet  4  . amLODipine (NORVASC) 5 MG tablet TAKE 1 TABLET BY MOUTH EVERY DAY  90 tablet  2   No current facility-administered medications for this visit.    History   Social History Narrative  . No narrative on file    ROS: A comprehensive Review of Systems - Negative except Mild arthritic pains. He does note that it immediately placed and does a lot of extra exercise he will short of breath or tired. It's mostly fatigue in. When that happens, he may feel a couple palpitation  PHYSICAL EXAM BP 124/72  Pulse 60  Ht 5\' 9"  (1.753 m)  Wt 172 lb 11.2 oz (78.336 kg)  BMI 25.49 kg/m2 General appearance: alert,  cooperative, appears stated age, no distress and For a healthy-appearing. Pleasant mood and affect appear well-groomed well-nourished. Alert and oriented x3 and answering questions probably. Neck: no adenopathy, no JVD, supple, symmetrical, trachea midline, thyroid not enlarged, symmetric, no tenderness/mass/nodules and He does have a left-sided carotid bruit and a very faint but also table subclavian bruit. It is cephalad and lateral to what would be considered a pulmonic murmur. Lungs: clear to auscultation bilaterally, normal percussion bilaterally and Nonlabored, good air movement. No W./R./R. Heart: normal apical impulse, regular rate and rhythm, S1, S2 normal, no S3 or S4, systolic murmur: systolic ejection 1/6, crescendo, decrescendo and Early peaking radiates to carotids and no rub Abdomen: soft, non-tender; bowel sounds normal; no masses,  no organomegaly Extremities: extremities normal, atraumatic, no cyanosis or edema, no edema, redness or tenderness in the calves or thighs, no ulcers, gangrene or trophic changes and Mild venous stasis changes Pulses: Mildly diminished pulse on the left wrist versus the right but otherwise 2+ and equal throughout Neurologic: Alert and oriented X 3, normal strength and tone. Normal symmetric reflexes. Normal coordination and gait  [id number] today: Yes Rate: 60 , Rhythm: Normal sinus, first degree AV block, borderline left axis deviation;  was normal Recent Labs: None  ASSESSMENT / PLAN:  Subclavian arterial stenosis Asymptomatic and stable.  At last check was on the low end of the 70-99% range.  Plan: Continue aggressive risk factor modification with lipid, blood pressure and glucose control. Recheck next year before next visit. Continue aspirin, statin, beta blocker and calcium channel blocker I educated him on the signs and symptoms of subclavian steal. He should be on the lookout for these episodes or symptoms. Such symptoms would be an indication  for intervention.  Hypertension Stable and well-controlled should check blood pressures in the right arm is post left. For future office visits at our office, he shouldn't check bilaterally.  Hyperlipidemia On statin. Labs followed by primary physician.    Orders Placed This Encounter  Procedures  . Doppler carotid    Left carotid and left subclavian doppler    Standing Status: Future     Number of Occurrences:      Standing Expiration Date: 02/14/2014    Order Specific Question:  Laterality    Answer:  Bilateral    Order Specific Question:  Where should this test be performed:    Answer:  [facility]     Followup: One year  DAVID W. [name], M.D., M.S. [facility] [address] [place], [place]  [address]  [phone] Pager # [phone]

## 2013-02-21 NOTE — Assessment & Plan Note (Signed)
Stable and well-controlled should check blood pressures in the right arm is post left. For future office visits at our office, he shouldn't check bilaterally.

## 2013-02-22 ENCOUNTER — Encounter (HOSPITAL_COMMUNITY): Payer: Medicare Other

## 2013-03-03 ENCOUNTER — Encounter (HOSPITAL_COMMUNITY): Payer: Medicare Other

## 2013-03-07 ENCOUNTER — Ambulatory Visit (HOSPITAL_COMMUNITY)
Admission: RE | Admit: 2013-03-07 | Discharge: 2013-03-07 | Disposition: A | Discharge status: Home / Self Care | Payer: Medicare Other | Source: Ambulatory Visit | Attending: Cardiology | Admitting: Cardiology

## 2013-03-07 DIAGNOSIS — I771 Stricture of artery: Secondary | ICD-10-CM | POA: Insufficient documentation

## 2013-03-07 HISTORY — PX: OTHER SURGICAL HISTORY: SHX169

## 2013-03-07 NOTE — Progress Notes (Signed)
Carotid Duplex Completed. °Brianna L Mazza,RVT °

## 2013-03-08 ENCOUNTER — Telehealth: Payer: Self-pay | Admitting: *Deleted

## 2013-03-08 NOTE — Telephone Encounter (Signed)
Message copied by Tobin Chad on Wed Mar 08, 2013  5:00 PM ------      Message from: [facility], DAVID      Created: Wed Mar 08, 2013  4:43 PM       Looks like stable L SCA stenosis.            Continue to monitor. ------

## 2013-03-08 NOTE — Telephone Encounter (Signed)
Spoke to patient. Result given . Verbalized understanding  

## 2013-03-09 ENCOUNTER — Other Ambulatory Visit: Payer: Self-pay | Admitting: Family Medicine

## 2013-03-14 ENCOUNTER — Ambulatory Visit (INDEPENDENT_AMBULATORY_CARE_PROVIDER_SITE_OTHER): Payer: Medicare Other | Admitting: Medical

## 2013-03-14 ENCOUNTER — Other Ambulatory Visit: Payer: Self-pay | Admitting: Family Medicine

## 2013-03-14 ENCOUNTER — Encounter: Payer: Self-pay | Admitting: Medical

## 2013-03-14 VITALS — BP 130/70 | HR 56 | Temp 98.0°F | Resp 14 | Wt 169.0 lb

## 2013-03-14 DIAGNOSIS — J310 Chronic rhinitis: Secondary | ICD-10-CM

## 2013-03-14 DIAGNOSIS — R231 Pallor: Secondary | ICD-10-CM

## 2013-03-14 MED ORDER — MOMETASONE FUROATE 50 MCG/ACT NA SUSP: 2.0000 | Freq: Every day | NASAL | Status: DC

## 2013-03-14 NOTE — Progress Notes (Signed)
Subjective:  Marc Freeman is a 79 y.o. male who presents for 1 wk hx/o runny nose and nasal congestion.  Started a week ago with a cold and now thinks he has a sinus infection.  He reports ear pressure, mild cough, mild sore throat, runny nose, congestion in nose, some yellow nasal discharge.   Denies fever, chills, nausea, vomiting, SOB, itchy water eyes, no sneezing.   He does report seasonal allergies.  Not using anything currently for symptoms.  Denies sick contacts.  No other aggravating or relieving factors.  He wants a blood count to check for anemia.  His dentist wanted it checked since his gums looked pale. No other c/o.  ROS as in subjective.   Objective: Filed Vitals:   03/14/13 1119  BP: 130/70  Pulse: 56  Temp: 98 F (36.7 C)  Resp: 14    General appearance: Alert, WD/WN, no distress, hard of hearing, hearing aids present                             Skin: warm, no rash                           Head: no sinus tenderness                            Eyes: conjunctiva normal, corneas clear, PERRLA                            Ears: pearly TMs, external ear canals normal                          Nose: septum midline, turbinates swollen, without erythema but +clear discharge             Mouth/throat: MMM, tongue normal, no pharyngeal erythema                           Neck: supple, no adenopathy, no thyromegaly, nontender                          Heart: RRR, normal S1, S2, no murmurs                         Lungs: CTA bilaterally, no wheezes, rales, or rhonchi     Assessment: Encounter Diagnoses  Name Primary?  . Pallor Yes  . Rhinitis      Plan: Advised that his symptoms today are more consistent with either a resolving cold or allergic rhinitis/allergies.  I don't suspect sinusitis or ear infection at this time.  Given your symptoms currently and history of seasonal allergies,  Begin Claritin OTC daily  Begin Nasonex 1 spray per nostril twice daily for a week,  then once daily.   Use this regimen for the next 1-2 months if this seems to be working.  You can also use nasal saline spray to flush the nasal passages  Drink plenty of water  Call back if worse symptoms such as fever, sinus pressure, teeth pain,or feeling sick.    We will call back with results of your blood count.

## 2013-03-14 NOTE — Patient Instructions (Addendum)
Your symptoms today are more consistent with either a resolving cold or allergic rhinitis/allergies.  Given your symptoms currently and history of seasonal allergies,  Begin Claritin OTC daily  Begin Nasonex 1 spray per nostril twice daily for a week, then once daily.   Use this regimen for the next 1-2 months if this seems to be working.  You can also use nasal saline spray to flush the nasal passages  Drink plenty of water  Call back if worse symptoms such as fever, sinus pressure, teeth pain,or feeling sick.    We will call back with results of your blood count.

## 2013-03-15 ENCOUNTER — Other Ambulatory Visit: Payer: Self-pay | Admitting: *Deleted

## 2013-03-15 DIAGNOSIS — I771 Stricture of artery: Secondary | ICD-10-CM

## 2013-03-15 LAB — CBC WITH DIFFERENTIAL/PLATELET
Lymphocytes Relative: 20 % (ref 12–46)
Lymphs Abs: 1.9 10*3/uL (ref 0.7–4.0)
Neutrophils Relative %: 67 % (ref 43–77)
Platelets: 304 10*3/uL (ref 150–400)
RBC: 3.94 MIL/uL — ABNORMAL LOW (ref 4.22–5.81)
WBC: 9.5 10*3/uL (ref 4.0–10.5)

## 2013-03-15 NOTE — Telephone Encounter (Signed)
Is this okay to refill? 

## 2013-03-15 NOTE — Telephone Encounter (Signed)
Per computer, was given #30 with 4 refils on 6/30.  Deny.  (?seen by Vincenza Hews yest--can check with him)

## 2013-03-16 ENCOUNTER — Telehealth: Payer: Self-pay | Admitting: Internal Medicine

## 2013-03-16 NOTE — Telephone Encounter (Signed)
Shane is this okay to refill 

## 2013-03-16 NOTE — Telephone Encounter (Signed)
If it was filled 6/30 with refills, then he has refills left

## 2013-03-16 NOTE — Telephone Encounter (Signed)
error 

## 2013-03-24 ENCOUNTER — Other Ambulatory Visit: Payer: Self-pay | Admitting: Orthopaedic Surgery

## 2013-03-24 DIAGNOSIS — M79604 Pain in right leg: Secondary | ICD-10-CM

## 2013-03-29 ENCOUNTER — Ambulatory Visit
Admission: RE | Admit: 2013-03-29 | Discharge: 2013-03-29 | Disposition: A | Discharge status: Home / Self Care | Payer: Medicare Other | Source: Ambulatory Visit | Attending: Orthopaedic Surgery | Admitting: Orthopaedic Surgery

## 2013-03-29 DIAGNOSIS — M79604 Pain in right leg: Secondary | ICD-10-CM

## 2013-03-29 MED ORDER — IOHEXOL 180 MG/ML  SOLN
18.0000 mL | Freq: Once | INTRAMUSCULAR | Status: AC | PRN
  Administered 2013-03-29: 18 mL via INTRATHECAL

## 2013-03-29 NOTE — Progress Notes (Signed)
Pt states he has been off tramadol for the past 2 days. Discharge instructions explained to pt and son.

## 2013-04-25 ENCOUNTER — Other Ambulatory Visit (INDEPENDENT_AMBULATORY_CARE_PROVIDER_SITE_OTHER): Payer: Medicare Other

## 2013-04-25 DIAGNOSIS — Z23 Encounter for immunization: Secondary | ICD-10-CM

## 2013-05-16 ENCOUNTER — Other Ambulatory Visit: Payer: Self-pay | Admitting: Family Medicine

## 2013-05-16 NOTE — Telephone Encounter (Signed)
IS THIS OK 

## 2013-05-16 NOTE — Telephone Encounter (Signed)
Called in med 

## 2013-06-01 ENCOUNTER — Other Ambulatory Visit: Payer: Self-pay | Admitting: Cardiology

## 2013-06-02 NOTE — Telephone Encounter (Signed)
Rx was sent to pharmacy electronically. 

## 2013-06-06 ENCOUNTER — Other Ambulatory Visit: Payer: Self-pay | Admitting: Medical

## 2013-06-06 ENCOUNTER — Other Ambulatory Visit: Payer: Self-pay | Admitting: Family Medicine

## 2013-06-06 ENCOUNTER — Telehealth: Payer: Self-pay | Admitting: Internal Medicine

## 2013-06-06 MED ORDER — MOMETASONE FUROATE 50 MCG/ACT NA SUSP: 2.0000 | Freq: Every day | NASAL | Status: DC

## 2013-06-06 NOTE — Telephone Encounter (Signed)
Rx refill was sent. CLS 

## 2013-06-06 NOTE — Telephone Encounter (Signed)
Request for a 90 day supply for nasonex to [facility] [address]

## 2013-07-15 ENCOUNTER — Other Ambulatory Visit: Payer: Self-pay | Admitting: Family Medicine

## 2013-07-17 ENCOUNTER — Other Ambulatory Visit: Payer: Self-pay

## 2013-07-17 NOTE — Telephone Encounter (Signed)
IS THIS OKAY TO REFILL 

## 2013-07-17 NOTE — Telephone Encounter (Signed)
CALLED IN TRAMADOL BECAUSE IT PRINTED

## 2013-08-01 ENCOUNTER — Encounter: Payer: Self-pay | Admitting: Family Medicine

## 2013-08-01 ENCOUNTER — Ambulatory Visit (INDEPENDENT_AMBULATORY_CARE_PROVIDER_SITE_OTHER): Payer: Medicare Other | Admitting: Family Medicine

## 2013-08-01 VITALS — BP 122/74 | HR 69 | Wt 174.0 lb

## 2013-08-01 DIAGNOSIS — I499 Cardiac arrhythmia, unspecified: Secondary | ICD-10-CM

## 2013-08-01 DIAGNOSIS — R609 Edema, unspecified: Secondary | ICD-10-CM

## 2013-08-01 DIAGNOSIS — R0602 Shortness of breath: Secondary | ICD-10-CM

## 2013-08-01 LAB — CBC WITH DIFFERENTIAL/PLATELET
BASOS ABS: 0.1 10*3/uL (ref 0.0–0.1)
BASOS PCT: 1 % (ref 0–1)
EOS ABS: 0.5 10*3/uL (ref 0.0–0.7)
EOS PCT: 4 % (ref 0–5)
HCT: 34.3 % — ABNORMAL LOW (ref 39.0–52.0)
Hemoglobin: 11.6 g/dL — ABNORMAL LOW (ref 13.0–17.0)
Lymphocytes Relative: 18 % (ref 12–46)
Lymphs Abs: 2 10*3/uL (ref 0.7–4.0)
MCH: 30.8 pg (ref 26.0–34.0)
MCHC: 33.8 g/dL (ref 30.0–36.0)
MCV: 91 fL (ref 78.0–100.0)
Monocytes Absolute: 0.8 10*3/uL (ref 0.1–1.0)
Monocytes Relative: 8 % (ref 3–12)
NEUTROS PCT: 69 % (ref 43–77)
Neutro Abs: 7.5 10*3/uL (ref 1.7–7.7)
PLATELETS: 257 10*3/uL (ref 150–400)
RBC: 3.77 MIL/uL — ABNORMAL LOW (ref 4.22–5.81)
RDW: 13.9 % (ref 11.5–15.5)
WBC: 10.8 10*3/uL — ABNORMAL HIGH (ref 4.0–10.5)

## 2013-08-01 LAB — COMPREHENSIVE METABOLIC PANEL
ALK PHOS: 67 U/L (ref 39–117)
ALT: <8 U/L (ref 0–53)
AST: 19 U/L (ref 0–37)
Albumin: 3.7 g/dL (ref 3.5–5.2)
BUN: 32 mg/dL — ABNORMAL HIGH (ref 6–23)
CO2: 26 mEq/L (ref 19–32)
CREATININE: 1.85 mg/dL — AB (ref 0.50–1.35)
Calcium: 9 mg/dL (ref 8.4–10.5)
Chloride: 105 mEq/L (ref 96–112)
Glucose, Bld: 99 mg/dL (ref 70–99)
Potassium: 4.6 mEq/L (ref 3.5–5.3)
Sodium: 140 mEq/L (ref 135–145)
Total Bilirubin: 0.4 mg/dL (ref 0.3–1.2)
Total Protein: 6.2 g/dL (ref 6.0–8.3)

## 2013-08-01 NOTE — Progress Notes (Signed)
   Subjective:    Patient ID: Marc Freeman, male    DOB: 1923/04/06, 80 y.o.   MRN: [id number]  HPI In the last 4 weeks he has noted increased swelling in his feet. No chest pain, some shortness of breath, no weakness, PND. He does note that his feet to swell up more when he is on them all day. He has noted no swelling anywhere else.   Review of Systems     Objective:   Physical Exam alert and in no distress. Tympanic membranes and canals are normal. Throat is clear. Tonsils are normal. Neck is supple without adenopathy or thyromegaly. Cardiac exam shows an  irregular  rhythm without murmurs or gallops. Lungs are clear to auscultation. Exam does show 1+ pitting edema with good pulses and normal capillary refill. EKG does show a unifocal PVC with compensatory pause.        Assessment & Plan:  Peripheral edema - Plan: Brain natriuretic peptide, CBC with Differential, Comprehensive metabolic panel, EKG 12-Lead  Arrhythmia - Plan: Brain natriuretic peptide, CBC with Differential, Comprehensive metabolic panel, EKG 12-Lead  SOB (shortness of breath) - Plan: Brain natriuretic peptide  discussed the causes of swelling in his feet. I will make sure this is not cardiac and if not, we'll blame this mostly in the dependent edema and give him instructions on proper care.

## 2013-08-02 LAB — BRAIN NATRIURETIC PEPTIDE: Brain Natriuretic Peptide: 243.3 pg/mL — ABNORMAL HIGH (ref 0.0–100.0)

## 2013-08-04 ENCOUNTER — Ambulatory Visit (INDEPENDENT_AMBULATORY_CARE_PROVIDER_SITE_OTHER): Payer: Medicare Other | Admitting: Cardiology

## 2013-08-04 ENCOUNTER — Encounter: Payer: Self-pay | Admitting: Cardiology

## 2013-08-04 VITALS — BP 120/80 | HR 60 | Ht 69.0 in | Wt 176.0 lb

## 2013-08-04 DIAGNOSIS — E785 Hyperlipidemia, unspecified: Secondary | ICD-10-CM

## 2013-08-04 DIAGNOSIS — I771 Stricture of artery: Secondary | ICD-10-CM

## 2013-08-04 DIAGNOSIS — N183 Chronic kidney disease, stage 3 unspecified: Secondary | ICD-10-CM | POA: Insufficient documentation

## 2013-08-04 DIAGNOSIS — I10 Essential (primary) hypertension: Secondary | ICD-10-CM

## 2013-08-04 DIAGNOSIS — R600 Localized edema: Secondary | ICD-10-CM | POA: Insufficient documentation

## 2013-08-04 DIAGNOSIS — R609 Edema, unspecified: Secondary | ICD-10-CM

## 2013-08-04 NOTE — Assessment & Plan Note (Signed)
Asymptomatic. 

## 2013-08-04 NOTE — Assessment & Plan Note (Signed)
SCr 1.85- range 1.5-1.9 over the past several years

## 2013-08-04 NOTE — Assessment & Plan Note (Signed)
Primary follows 

## 2013-08-04 NOTE — Assessment & Plan Note (Signed)
Sent from Dr Sidney AceLa Londe's office with LE edema

## 2013-08-04 NOTE — Progress Notes (Signed)
08/04/2013 Marc Freeman   [birth year]  [id number]  Primary Physicia Carollee HerterLALONDE,Marc CHARLES, MD Primary Cardiologist: Dr Herbie BaltimoreHarding  HPI:  Pleasant 80 y/o followed by Dr Susann GivensLalonde and Dr harding with a history of HTN, and PVD. He has asymptomatic LSCA stenosis. He has had prior low risk Myoview in 2009. Echo in 2009 showed mild pulmonary HTN and AOV sclerosis with an EF of > 55%. He was sent to the office today with LE edema which has been a chronic issue but worse lately. He denies any SOB or orthopnea.    Current Outpatient Prescriptions  Medication Sig Dispense Refill  . amLODipine (NORVASC) 5 MG tablet TAKE 1 TABLET BY MOUTH EVERY DAY  90 tablet  2  . aspirin 81 MG tablet Take 81 mg by mouth daily.        Marland Kitchen. doxycycline (MONODOX) 100 MG capsule Take 100 mg by mouth daily.      . finasteride (PROSCAR) 5 MG tablet Take 5 mg by mouth daily.      . Ibuprofen (ADVIL PO) Take 1 capsule by mouth as needed (for pain).      . metoprolol succinate (TOPROL-XL) 50 MG 24 hr tablet TAKE 1 TABLET EVERY DAY  90 tablet  2  . mometasone (NASONEX) 50 MCG/ACT nasal spray Place 2 sprays into the nose daily.  51 g  1  . Multiple Vitamins-Minerals (MULTIVITAMIN WITH MINERALS) tablet Take 1 tablet by mouth daily.        . simvastatin (ZOCOR) 20 MG tablet TAKE 1 TABLET BY MOUTH ONCE A DAY  90 tablet  PRN  . tamsulosin (FLOMAX) 0.4 MG CAPS capsule TAKE 1 CAPSULE (0.4 MG TOTAL) BY MOUTH DAILY.  90 capsule  3  . traMADol (ULTRAM) 50 MG tablet TAKE ONE TABLET BY MOUTH TW ICE DAILY AS NEEDED FOR PAIN  30 tablet  1   No current facility-administered medications for this visit.    Allergies  Allergen Reactions  . Penicillins Rash    History   Social History  . Marital Status: Married    Spouse Name: Marc Freeman    Number of Children: Marc Freeman  . Years of Education: Marc Freeman   Occupational History  . Not on file.   Social History Main Topics  . Smoking status: Never Smoker   . Smokeless tobacco: Never Used  . Alcohol Use: Yes      Comment: Occasionally  . Drug Use: No  . Sexual Activity: Not on file   Other Topics Concern  . Not on file   Social History Narrative   He is a married, father of 2, grandfather of 2. Never smoked, uses occasional alcohol beverage. Relatively active but does not do routine exercise.     Review of Systems: General: negative for chills, fever, night sweats or weight changes.  Cardiovascular: negative for chest pain, dyspnea on exertion, edema, orthopnea, palpitations, paroxysmal nocturnal dyspnea or shortness of breath Dermatological: negative for rash Respiratory: negative for cough or wheezing Urologic: negative for hematuria Abdominal: negative for nausea, vomiting, diarrhea, bright red blood per rectum, melena, or hematemesis Neurologic: negative for visual changes, syncope, or dizziness All other systems reviewed and are otherwise negative except as noted above.    Blood pressure 120/80, pulse 60, height 5\' 9"  (1.753 m), weight 176 lb (79.833 kg).  General appearance: alert, cooperative and no distress Neck: no JVD and RCA bruit Lungs: clear to auscultation bilaterally Heart: regular rate and rhythm and 2/6 systolic murmur Extremities: 1+ bilat pitting ankle  edema  Lab- BNP 243  ASSESSMENT AND PLAN:   Edema extremities Sent from Dr Sidney Ace office with LE edema  Hyperlipidemia Primary follows  Hypertension Treated  Subclavian arterial stenosis Asymptomatic  Chronic renal disease, stage III SCr 1.85- range 1.5-1.9 over the past several years   PLAN  I suggested LE compression stockings and salt avoidence. I don't think he is in CHF. He'll see Dr [name] in 3 months.  Zacharey Jensen KPA-C 08/04/2013 2:41 PM

## 2013-08-04 NOTE — Assessment & Plan Note (Signed)
Treated

## 2013-08-04 NOTE — Patient Instructions (Signed)
Compression stockings, low salt diet, elevate your legs when possible Your physician recommends that you schedule a follow-up appointment in: 3 months with Dr Herbie BaltimoreHarding

## 2013-08-10 ENCOUNTER — Telehealth: Payer: Self-pay | Admitting: Internal Medicine

## 2013-08-10 ENCOUNTER — Other Ambulatory Visit: Payer: Self-pay | Admitting: Family Medicine

## 2013-08-10 MED ORDER — MOMETASONE FUROATE 50 MCG/ACT NA SUSP: 2.0000 | Freq: Every day | NASAL | Status: AC

## 2013-08-10 NOTE — Telephone Encounter (Signed)
Refill request for nasonex 60mcg nasal spray #90 to [facility] [address]

## 2013-08-10 NOTE — Telephone Encounter (Signed)
Rx refill was sent to his pharmacy. CLS 

## 2013-09-11 ENCOUNTER — Other Ambulatory Visit: Payer: Self-pay | Admitting: Family Medicine

## 2013-09-11 NOTE — Telephone Encounter (Signed)
Called out med to pharmacy 

## 2013-09-11 NOTE — Telephone Encounter (Signed)
Okay to renew his medication.

## 2013-09-11 NOTE — Telephone Encounter (Signed)
Is this okay to call in? 

## 2013-11-02 ENCOUNTER — Ambulatory Visit: Payer: Medicare Other | Admitting: Cardiology

## 2013-11-05 ENCOUNTER — Other Ambulatory Visit: Payer: Self-pay | Admitting: Family Medicine

## 2013-11-10 ENCOUNTER — Ambulatory Visit (INDEPENDENT_AMBULATORY_CARE_PROVIDER_SITE_OTHER): Payer: Medicare Other | Admitting: Cardiology

## 2013-11-10 ENCOUNTER — Encounter: Payer: Self-pay | Admitting: Cardiology

## 2013-11-10 ENCOUNTER — Other Ambulatory Visit: Payer: Self-pay | Admitting: Family Medicine

## 2013-11-10 VITALS — BP 132/78 | HR 58 | Ht 69.0 in | Wt 168.6 lb

## 2013-11-10 DIAGNOSIS — I771 Stricture of artery: Secondary | ICD-10-CM

## 2013-11-10 DIAGNOSIS — I10 Essential (primary) hypertension: Secondary | ICD-10-CM

## 2013-11-10 DIAGNOSIS — R600 Localized edema: Secondary | ICD-10-CM

## 2013-11-10 DIAGNOSIS — E785 Hyperlipidemia, unspecified: Secondary | ICD-10-CM

## 2013-11-10 DIAGNOSIS — R609 Edema, unspecified: Secondary | ICD-10-CM

## 2013-11-10 NOTE — Telephone Encounter (Signed)
IS THIS OKAY TO REFILL 

## 2013-11-10 NOTE — Patient Instructions (Signed)
No change continue current medication   Your physician wants you to follow-up in 12 month Dr Herbie BaltimoreHarding. will receive a reminder letter in the mail two months in advance. If you don't receive a letter, please call our office to schedule the follow-up appointment.

## 2013-11-11 ENCOUNTER — Encounter: Payer: Self-pay | Admitting: Cardiology

## 2013-11-11 NOTE — Assessment & Plan Note (Signed)
Stable, treated by PCP

## 2013-11-11 NOTE — Progress Notes (Signed)
PATIENT: Marc Freeman MRN: [id number]  DOB: 1923-04-04   DOV:11/11/2013 PCP: Carollee HerterLALONDE,Marc CHARLES, MD  Clinic Note: Chief Complaint  Patient presents with  . 3 month visit    no chest pain , no sob ,  edema --using support stocking, back pain     HPI: Marc Freeman is a 80 y.o.  male with a PMH below who presents today for six-month followup (last seen in August 2014) mostly for subclavian artery stenosis and hypertension. His wife Marc Freeman is also patient of mine. He saw Marc Freeman, [place] back in January for edema. [name] recommended wearing compression stockings and reducing salt intake. He had followup carotid Dopplers done after I saw him in August it showed relatively stable if not almost improved left subclavian stenosis  Interval History: He presents today doing quite well, relatively stable. His swelling is notably improved with the use of his compression stockings. He states it but as we would like to do whenever he wants to do it. He just can't do it very fast. Chest tightness or pressure at rest or exertion. No PND or orthopnea to suggest heart failure symptoms. The most part he denies any subclavian steal symptoms, but does note that when he pulled his left arm across his chest, it will start to feel somewhat numb and cold. Otherwise no symptoms.  The remainder of Cardiovascular ROS: no chest pain or dyspnea on exertion positive for - edema and irregular heartbeat negative for - loss of consciousness, murmur, orthopnea, paroxysmal nocturnal dyspnea, rapid heart rate, shortness of breath or Lightheadedness, dizziness, TIA/amaurosis fugax, syncope/near syncope, claudication:   Past Medical History  Diagnosis Date  . Hypertension   . Actinic keratosis   . Hearing loss     SENSORI NEURAL HEARING LOSS  . History of BPH   . Dyslipidemia   . Renal insufficiency   . Diverticulosis   . Hyperlipidemia   . Subclavian artery disease 12/17/2011    Right brachial pressure: 150mmHg; Left  128mmHg , 70-99% Left Subclavian Artery Stenosis --> read as 50 and 69% in August 2014  . Rosacea     Past Cardiovascular Evaluation/Procedures   Procedure Laterality Date  . Doppler echocardiography  10/10/2007    moderate concentric LVH ,EF 55% , mild sclerrotic aortic valve with trace without regurg. and mild tricuspid regurg.  Marland Kitchen. Nm myocar perf wall motion  10/10/2007    no ischemia ,no infarction with just diaphragmatic attentuation   MEDICATIONS AND ALLERGIES -- reviewed in Epic Social and Family History -- reviewed in Epic  ROS: A comprehensive Review of Systems - Negative except Symptoms noted in history of present illness.  PHYSICAL EXAM BP 132/78  Pulse 58  Ht 5\' 9"  (1.753 m)  Wt 168 lb 9.6 oz (76.476 kg)  BMI 24.89 kg/m2 General appearance: Pleasant, relatively healthy-appearing elderly gentleman. NAD. A&O x 3; well-nourished and well-groomed Neck: no adenopathy, no carotid bruit, no JVD and supple, symmetrical, trachea midline Lungs: clear to auscultation bilaterally, normal percussion bilaterally and Nonlabored, good air movement. No W./R./R. Heart: RRR with normal S1, S2. Nondisplaced PMI. 1/6C-D. early peaking SEM at RUSB radiates to carotids. No R./G. Abdomen: soft, non-tender; bowel sounds normal; no masses,  no organomegaly Extremities: 1+ edema with compression stockings in place. Mild venous stasis changes noted. Pulses: 2+ and symmetric - with the exception of mildly reduced in the left wrist. Neurologic: Grossly normal   Adult ECG Report  Rate: 58 ;  Rhythm: sinus bradycardia and premature atrial  contractions (PAC)  QRS Axis: -22 ;  PR Interval: 194 ;  QRS Duration: 122 ; QTc: 410; Voltages: Normal  Conduction Disturbances: none and first AV blockAV block  Other Abnormalities: none   Narrative Interpretation: Stable EKG  Recent Labs: None available  ASSESSMENT / PLAN: Subclavian arterial stenosis Stable moderate left subclavian artery stenosis. This is  the first time heard any symptoms that may be associated with it. The holding the arm across the chest noting symptoms is something to be record. However otherwise no subclavian steal or other symptoms. He is reluctant to have any invasive procedures unless he becomes very symptomatic. Continue risk factor modification  Edema extremities Notably improved on her stockings. I agree with not using any diuretic.  Hypertension Stable, treated by PCP  Hyperlipidemia Monitored and treated by PCP    Orders Placed This Encounter  Procedures  . EKG 12-Lead   No orders of the defined types were placed in this encounter.    Followup: One year  Marc Freeman, M.D., M.S. Interventional Cardiology [facility]

## 2013-11-11 NOTE — Assessment & Plan Note (Signed)
Notably improved on her stockings. I agree with not using any diuretic.

## 2013-11-11 NOTE — Assessment & Plan Note (Signed)
Stable moderate left subclavian artery stenosis. This is the first time heard any symptoms that may be associated with it. The holding the arm across the chest noting symptoms is something to be record. However otherwise no subclavian steal or other symptoms. He is reluctant to have any invasive procedures unless he becomes very symptomatic. Continue risk factor modification

## 2013-11-11 NOTE — Assessment & Plan Note (Signed)
Monitored and treated by PCP

## 2013-11-12 NOTE — Telephone Encounter (Signed)
Ok to renew?  

## 2013-11-13 NOTE — Telephone Encounter (Signed)
Called in med 

## 2013-12-14 ENCOUNTER — Other Ambulatory Visit: Payer: Self-pay | Admitting: Family Medicine

## 2013-12-14 NOTE — Telephone Encounter (Signed)
Okay to renew

## 2013-12-14 NOTE — Telephone Encounter (Signed)
Medication called in 

## 2013-12-14 NOTE — Telephone Encounter (Signed)
Is this okay to refill? 

## 2013-12-16 ENCOUNTER — Other Ambulatory Visit: Payer: Self-pay | Admitting: Family Medicine

## 2014-01-15 ENCOUNTER — Other Ambulatory Visit: Payer: Self-pay | Admitting: Family Medicine

## 2014-01-15 NOTE — Telephone Encounter (Signed)
Okay to renew

## 2014-01-15 NOTE — Telephone Encounter (Signed)
IS THIS OKAY 

## 2014-02-16 ENCOUNTER — Other Ambulatory Visit: Payer: Self-pay | Admitting: Family Medicine

## 2014-02-16 NOTE — Telephone Encounter (Signed)
Is this okay to refill? 

## 2014-02-17 NOTE — Telephone Encounter (Signed)
OK to refill

## 2014-02-26 ENCOUNTER — Ambulatory Visit (HOSPITAL_COMMUNITY)
Admission: RE | Admit: 2014-02-26 | Discharge: 2014-02-26 | Disposition: A | Discharge status: Home / Self Care | Payer: Medicare Other | Source: Ambulatory Visit | Attending: Cardiovascular Disease | Admitting: Cardiovascular Disease

## 2014-02-26 ENCOUNTER — Other Ambulatory Visit (HOSPITAL_COMMUNITY): Payer: Self-pay | Admitting: Cardiology

## 2014-02-26 DIAGNOSIS — I6529 Occlusion and stenosis of unspecified carotid artery: Secondary | ICD-10-CM

## 2014-02-26 DIAGNOSIS — I771 Stricture of artery: Secondary | ICD-10-CM | POA: Diagnosis present

## 2014-02-26 DIAGNOSIS — I779 Disorder of arteries and arterioles, unspecified: Secondary | ICD-10-CM

## 2014-02-26 NOTE — Progress Notes (Signed)
Carotid Duplex Completed. Nikia Levels, BS, RDMS, RVT  

## 2014-03-08 ENCOUNTER — Telehealth: Payer: Self-pay | Admitting: *Deleted

## 2014-03-08 DIAGNOSIS — I771 Stricture of artery: Secondary | ICD-10-CM

## 2014-03-08 NOTE — Telephone Encounter (Signed)
Spoke to patient. Result given . Verbalized understanding RECHECK 8/ 2016

## 2014-03-08 NOTE — Telephone Encounter (Signed)
Message copied by Tobin ChadMARTIN, Quianna Avery V. 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

## 2014-03-08 NOTE — Telephone Encounter (Signed)
FUTURE ORDER DONE

## 2014-03-09 ENCOUNTER — Other Ambulatory Visit: Payer: Self-pay | Admitting: *Deleted

## 2014-03-09 MED ORDER — METOPROLOL SUCCINATE ER 50 MG PO TB24
50.0000 mg | ORAL_TABLET | Freq: Every day | ORAL | Status: AC
Start: 2014-03-09 — End: ?

## 2014-03-15 ENCOUNTER — Other Ambulatory Visit: Payer: Self-pay | Admitting: Family Medicine

## 2014-03-15 NOTE — Telephone Encounter (Signed)
Is this okay last ov 07/2013

## 2014-03-29 ENCOUNTER — Other Ambulatory Visit: Payer: Self-pay | Admitting: Family Medicine

## 2014-03-29 NOTE — Telephone Encounter (Signed)
Is this okay?

## 2014-03-29 NOTE — Telephone Encounter (Signed)
Okay to renew

## 2014-04-10 ENCOUNTER — Other Ambulatory Visit (INDEPENDENT_AMBULATORY_CARE_PROVIDER_SITE_OTHER): Payer: Medicare Other

## 2014-04-10 DIAGNOSIS — Z23 Encounter for immunization: Secondary | ICD-10-CM

## 2014-05-02 ENCOUNTER — Other Ambulatory Visit: Payer: Self-pay | Admitting: Family Medicine

## 2014-05-02 NOTE — Telephone Encounter (Signed)
IS THIS OKAY 

## 2014-05-04 ENCOUNTER — Other Ambulatory Visit: Payer: Self-pay | Admitting: Family Medicine

## 2014-06-04 ENCOUNTER — Other Ambulatory Visit: Payer: Self-pay | Admitting: Family Medicine

## 2014-06-04 NOTE — Telephone Encounter (Signed)
Called in med to pharmacy. Did not have time to set pt up an appt.

## 2014-06-04 NOTE — Telephone Encounter (Signed)
Is this okay?

## 2014-06-04 NOTE — Telephone Encounter (Signed)
Okay to renew

## 2014-06-04 NOTE — Telephone Encounter (Signed)
Okay to renew but have him set up an appointment 

## 2014-06-05 NOTE — Telephone Encounter (Signed)
Pt is scheduled for Thursday for a follow-up

## 2014-06-07 ENCOUNTER — Ambulatory Visit (INDEPENDENT_AMBULATORY_CARE_PROVIDER_SITE_OTHER): Payer: Medicare Other | Admitting: Family Medicine

## 2014-06-07 ENCOUNTER — Encounter: Payer: Self-pay | Admitting: Family Medicine

## 2014-06-07 VITALS — BP 110/60 | HR 79 | Wt 170.0 lb

## 2014-06-07 DIAGNOSIS — M4806 Spinal stenosis, lumbar region: Secondary | ICD-10-CM

## 2014-06-07 DIAGNOSIS — M48061 Spinal stenosis, lumbar region without neurogenic claudication: Secondary | ICD-10-CM | POA: Insufficient documentation

## 2014-06-07 NOTE — Progress Notes (Signed)
   Subjective:    Patient ID: Marc Freeman, male    DOB: December 18, 1922, 80 y.o.   MRN: [id number]  HPI He is here for consult concerning continued difficulty with back pain. He has been followed by Dr. Ophelia CharterYates. Apparently epidural injections were given with minimal success. He is now to good surgical candidate. He has been taking tramadol with relief of his symptoms. He has not tried adequate doses of Aleve or Advil.   Review of Systems     Objective:   Physical Exam Alert and in no distress otherwise not examined       Assessment & Plan:  Spinal stenosis of lumbar region  I will have him try 2 Aleve twice per day and compare this to 4 Advil 3 times per day. He is to use whichever works best. He can also supplement this with Tylenol and then use tramadol if he needs more relief.

## 2014-06-07 NOTE — Patient Instructions (Signed)
Take 2 Aleve twice per day and see how that helps .And if that doesn't work then try 4 Advil 3 times per day. See which one of these works the best and you can also supplement that with Tylenol. If that doesn't work then you can use the tramadol.

## 2014-06-09 ENCOUNTER — Other Ambulatory Visit: Payer: Self-pay | Admitting: Family Medicine

## 2014-06-11 ENCOUNTER — Other Ambulatory Visit: Payer: Self-pay | Admitting: Dermatology

## 2014-06-19 DEATH — deceased
